# Patient Record
Sex: Female | Born: 1990 | Race: Black or African American | Hispanic: No | Marital: Single | Smoking: Never smoker
Health system: Southern US, Community
[De-identification: ages and names within clinical notes are randomized; demographics above are authoritative.]

## PROBLEM LIST (undated history)

## (undated) ENCOUNTER — Inpatient Hospital Stay (HOSPITAL_COMMUNITY): Payer: Self-pay

## (undated) DIAGNOSIS — R87629 Unspecified abnormal cytological findings in specimens from vagina: Secondary | ICD-10-CM

## (undated) DIAGNOSIS — J45909 Unspecified asthma, uncomplicated: Secondary | ICD-10-CM

## (undated) DIAGNOSIS — T7840XA Allergy, unspecified, initial encounter: Secondary | ICD-10-CM

## (undated) HISTORY — DX: Allergy, unspecified, initial encounter: T78.40XA

---
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
## 2014-09-10 ENCOUNTER — Encounter (HOSPITAL_COMMUNITY): Payer: Self-pay | Admitting: *Deleted

## 2014-09-10 ENCOUNTER — Inpatient Hospital Stay (HOSPITAL_COMMUNITY)
Admission: AD | Admit: 2014-09-10 | Discharge: 2014-09-10 | Disposition: A | Discharge status: Home / Self Care | Payer: 59 | Source: Ambulatory Visit | Attending: Obstetrics & Gynecology | Admitting: Obstetrics & Gynecology

## 2014-09-10 DIAGNOSIS — Z3201 Encounter for pregnancy test, result positive: Secondary | ICD-10-CM | POA: Insufficient documentation

## 2014-09-10 DIAGNOSIS — Z3200 Encounter for pregnancy test, result unknown: Secondary | ICD-10-CM | POA: Diagnosis present

## 2014-09-10 LAB — POCT PREGNANCY, URINE: PREG TEST UR: POSITIVE — AB

## 2014-09-10 NOTE — MAU Note (Signed)
Patient presents for a pregnancy test. States she had +HPT X 2. Denies bleeding or discharge

## 2014-09-10 NOTE — MAU Provider Note (Signed)
  History     CSN: [id number]  Arrival date and time: 09/10/14 1155   None     Chief Complaint  Patient presents with  . Possible Pregnancy   HPI [name] 24 y.o.  2495w3d  OB History    Gravida Para Term Preterm AB TAB SAB Ectopic Multiple Living   1 0        0      No past medical history on file.  No past surgical history on file.  No family history on file.  History  Substance Use Topics  . Smoking status: Not on file  . Smokeless tobacco: Not on file  . Alcohol Use: Not on file    Allergies: Allergies not on file  No prescriptions prior to admission    Review of Systems  Constitutional: Negative for fever.  Gastrointestinal: Negative for nausea, vomiting and abdominal pain.  Genitourinary:       No vaginal discharge. No vaginal bleeding. No dysuria  Musculoskeletal: Positive for back pain.   Physical Exam   Blood pressure 134/83, pulse 80, temperature 98.4 F (36.9 C), temperature source Oral, resp. rate 16, height 5\' 6"  (1.676 m), weight 138 lb 8 oz (62.823 kg), last menstrual period 08/17/2014.  Physical Exam  Nursing note and vitals reviewed. Constitutional: She is oriented to person, place, and time. She appears well-developed and well-nourished.  Moves without difficulty.  No pain today.  HENT:  Head: Normocephalic.  Eyes: EOM are normal.  Neck: Neck supple.  Musculoskeletal: Normal range of motion.  Neurological: She is alert and oriented to person, place, and time.  Skin: Skin is warm and dry.  Psychiatric: She has a normal mood and affect.    MAU Course  Procedures Results for orders placed or performed during the hospital encounter of 09/10/14 (from the past 24 hour(s))  Pregnancy, urine POC     Status: Abnormal   Collection Time: 09/10/14 12:16 PM  Result Value Ref Range   Preg Test, Ur POSITIVE (A) NEGATIVE    MDM Thought she might be pregnant.  Only needed to see for sure if she was pregnant.  No problems.  No further  exam needed.  Assessment and Plan  Pregnant at 8495w3d gestation  Plan Begin prenatal care as soon as possible - MD whose office is on [address]. No smoking, no alcohol, no drugs. Take Tylenol 325 mg 2 tablets by mouth every 4 hours if needed for pain.   BURLESON,TERRI 09/10/2014, 12:22 PM

## 2014-09-12 ENCOUNTER — Inpatient Hospital Stay (HOSPITAL_COMMUNITY)
Admission: AD | Admit: 2014-09-12 | Discharge: 2014-09-12 | Disposition: A | Discharge status: Home / Self Care | Payer: 59 | Source: Ambulatory Visit | Attending: Obstetrics & Gynecology | Admitting: Obstetrics & Gynecology

## 2014-09-12 ENCOUNTER — Encounter (HOSPITAL_COMMUNITY): Payer: Self-pay | Admitting: Obstetrics and Gynecology

## 2014-09-12 DIAGNOSIS — N926 Irregular menstruation, unspecified: Secondary | ICD-10-CM

## 2014-09-12 DIAGNOSIS — Z87891 Personal history of nicotine dependence: Secondary | ICD-10-CM | POA: Diagnosis not present

## 2014-09-12 DIAGNOSIS — Z3201 Encounter for pregnancy test, result positive: Secondary | ICD-10-CM | POA: Diagnosis not present

## 2014-09-12 DIAGNOSIS — R109 Unspecified abdominal pain: Secondary | ICD-10-CM | POA: Diagnosis present

## 2014-09-12 HISTORY — DX: Unspecified asthma, uncomplicated: J45.909

## 2014-09-12 LAB — URINALYSIS, ROUTINE W REFLEX MICROSCOPIC
Bilirubin Urine: NEGATIVE
GLUCOSE, UA: NEGATIVE mg/dL
Ketones, ur: NEGATIVE mg/dL
LEUKOCYTES UA: NEGATIVE
Nitrite: NEGATIVE
Protein, ur: 30 mg/dL — AB
UROBILINOGEN UA: 0.2 mg/dL (ref 0.0–1.0)
pH: 6 (ref 5.0–8.0)

## 2014-09-12 LAB — URINE MICROSCOPIC-ADD ON

## 2014-09-12 MED ORDER — PRENATAL VITAMINS 28-0.8 MG PO TABS: 1.0000 | ORAL_TABLET | Freq: Every day | ORAL | Status: DC

## 2014-09-12 NOTE — MAU Note (Signed)
Been having abd pain, comes and goes, feels like period cramps.

## 2014-09-12 NOTE — MAU Provider Note (Signed)
History     CSN: [id number]  Arrival date and time: 09/12/14 1247   None     Chief Complaint  Patient presents with  . Abdominal Pain   HPI   [name] is a 24 y.o. female G1P0 [redacted]w[redacted]d presenting to the MAU with abdominal cramping. She said that she first started noticing the cramping yesterday and the cramping occurs in the lower abdomen without radiating to one side. The cramps last only a few minutes. She is not currently in any pain. She currently rates her pain 0/10.  She says that the cramps feel just like her cramps during her period, which she says have always been bad. She also says that there is a little bit of low pack pain. She cramping is worse after eating [employer]. She endorses mild chills but denies any sharp stabbing pain, fever, vaginal discharge or bleeding, constipation, nausea, or vomiting.   She just found out that she was pregnant on 7/2 and has not had any prenatal care. She came in on &/4 and the pregnancy was confirmed via urine pregnancy test. She just wants to make sure that everything is okay with baby.   OB History    Gravida Para Term Preterm AB TAB SAB Ectopic Multiple Living        Past Medical History  Diagnosis Date  . Asthma     Past Surgical History  Procedure Laterality Date  . Wisdom tooth extraction Bilateral 2010    Family History  Problem Relation Age of Onset  . COPD Mother   . Seizures Brother   . Diabetes Paternal Aunt   . Diabetes Paternal Uncle   . Diabetes Paternal Grandmother   . Hypertension Paternal Grandmother   . Diabetes Paternal Grandfather   . Hypertension Paternal Grandfather     History  Substance Use Topics  . Smoking status: Former Smoker    Types: Cigars    Quit date: 09/10/2014  . Smokeless tobacco: Never Used  . Alcohol Use: No    Allergies:  Allergies  Allergen Reactions  . Banana Itching and Swelling  . Orange Fruit [Citrus] Itching and Swelling    No  prescriptions prior to admission   Results for orders placed or performed during the hospital encounter of 09/12/14 (from the past 48 hour(s))  Urinalysis, Routine w reflex microscopic (not at [facility])     Status: Abnormal   Collection Time: 09/12/14  1:45 PM  Result Value Ref Range   Color, Urine YELLOW YELLOW   APPearance CLEAR CLEAR   Specific Gravity, Urine >1.030 (H) 1.005 - 1.030   pH 6.0 5.0 - 8.0   Glucose, UA NEGATIVE NEGATIVE mg/dL   Hgb urine dipstick SMALL (A) NEGATIVE   Bilirubin Urine NEGATIVE NEGATIVE   Ketones, ur NEGATIVE NEGATIVE mg/dL   Protein, ur 30 (A) NEGATIVE mg/dL   Urobilinogen, UA 0.2 0.0 - 1.0 mg/dL   Nitrite NEGATIVE NEGATIVE   Leukocytes, UA NEGATIVE NEGATIVE  Urine microscopic-add on     Status: Abnormal   Collection Time: 09/12/14  1:45 PM  Result Value Ref Range   Squamous Epithelial / LPF FEW (A) RARE   WBC, UA 0-2 <3 WBC/hpf   RBC / HPF 0-2 <3 RBC/hpf   Bacteria, UA FEW (A) RARE   Urine-Other MUCOUS PRESENT     Review of Systems  Constitutional: Positive for chills (gets cold easily). Negative for fever and malaise/fatigue.  Gastrointestinal: Negative  for nausea, vomiting, abdominal pain, constipation and blood in stool.  Genitourinary: Negative for dysuria, urgency, frequency and hematuria.       Denies vaginal bleeding and discharge   Physical Exam   Blood pressure 107/73, pulse 85, temperature 98.4 F (36.9 C), temperature source Oral, resp. rate 18, height 5' 5.5" (1.664 m), weight 62.143 kg (137 lb), last menstrual period 08/17/2014, SpO2 100 %.  Physical Exam  Constitutional: She is oriented to person, place, and time. She appears well-developed and well-nourished. No distress.  GI: Soft. Bowel sounds are normal. There is no tenderness.  Neurological: She is alert and oriented to person, place, and time.  Skin: She is not diaphoretic.  Psychiatric: Her behavior is normal.    MAU Course  Procedures  None  MDM Patient discharged  for intermittent cramping. Not currently in pain.  Assessment and Plan   A:  1. Missed period   2. Encounter for pregnancy test, result positive    P:  Discharge home in stable condition Pregnancy verification letter given First trimester warning signs discussed. RX: prescribed prenatal vitamins. -Patient educated on worrisome signs and symptoms during pregnancy. -Follow up with prenatal care provider or return to MAU if experiencing any vaginal bleeding or abdominal pain.     Duane LopeJennifer I Emric Kowalewski, NP 09/12/2014 3:32 PM

## 2014-11-01 LAB — OB RESULTS CONSOLE HEPATITIS B SURFACE ANTIGEN: Hepatitis B Surface Ag: NEGATIVE

## 2014-11-01 LAB — OB RESULTS CONSOLE HIV ANTIBODY (ROUTINE TESTING): HIV: NONREACTIVE

## 2014-11-01 LAB — OB RESULTS CONSOLE RPR: RPR: NONREACTIVE

## 2014-11-01 LAB — OB RESULTS CONSOLE ANTIBODY SCREEN: Antibody Screen: NEGATIVE

## 2014-11-01 LAB — OB RESULTS CONSOLE ABO/RH: RH Type: POSITIVE

## 2014-11-01 LAB — OB RESULTS CONSOLE GC/CHLAMYDIA
CHLAMYDIA, DNA PROBE: NEGATIVE
GC PROBE AMP, GENITAL: NEGATIVE

## 2014-11-01 LAB — OB RESULTS CONSOLE RUBELLA ANTIBODY, IGM: RUBELLA: NON-IMMUNE/NOT IMMUNE

## 2014-11-02 ENCOUNTER — Other Ambulatory Visit (HOSPITAL_COMMUNITY): Payer: Self-pay | Admitting: Nurse Practitioner

## 2014-11-02 DIAGNOSIS — Z3A13 13 weeks gestation of pregnancy: Secondary | ICD-10-CM

## 2014-11-02 DIAGNOSIS — Z3682 Encounter for antenatal screening for nuchal translucency: Secondary | ICD-10-CM

## 2014-11-20 ENCOUNTER — Ambulatory Visit (HOSPITAL_COMMUNITY): Payer: Medicaid Other

## 2014-11-20 ENCOUNTER — Other Ambulatory Visit (HOSPITAL_COMMUNITY): Payer: 59

## 2014-11-26 ENCOUNTER — Other Ambulatory Visit (HOSPITAL_COMMUNITY): Payer: Self-pay | Admitting: Nurse Practitioner

## 2014-11-26 DIAGNOSIS — O09892 Supervision of other high risk pregnancies, second trimester: Secondary | ICD-10-CM

## 2014-11-26 DIAGNOSIS — Z141 Cystic fibrosis carrier: Principal | ICD-10-CM

## 2014-11-26 DIAGNOSIS — Z3A19 19 weeks gestation of pregnancy: Secondary | ICD-10-CM

## 2014-12-31 ENCOUNTER — Other Ambulatory Visit (HOSPITAL_COMMUNITY): Payer: Self-pay | Admitting: Obstetrics & Gynecology

## 2015-01-01 ENCOUNTER — Encounter: Payer: Self-pay | Admitting: Obstetrics & Gynecology

## 2015-01-01 ENCOUNTER — Other Ambulatory Visit (HOSPITAL_COMMUNITY): Payer: Self-pay | Admitting: Nurse Practitioner

## 2015-01-01 ENCOUNTER — Ambulatory Visit (HOSPITAL_COMMUNITY)
Admission: RE | Admit: 2015-01-01 | Discharge: 2015-01-01 | Disposition: A | Discharge status: Home / Self Care | Payer: Medicaid Other | Source: Ambulatory Visit | Attending: Nurse Practitioner | Admitting: Nurse Practitioner

## 2015-01-01 DIAGNOSIS — O09892 Supervision of other high risk pregnancies, second trimester: Secondary | ICD-10-CM | POA: Diagnosis not present

## 2015-01-01 DIAGNOSIS — Z141 Cystic fibrosis carrier: Secondary | ICD-10-CM

## 2015-01-01 DIAGNOSIS — Z3689 Encounter for other specified antenatal screening: Secondary | ICD-10-CM

## 2015-01-01 DIAGNOSIS — Z3A19 19 weeks gestation of pregnancy: Secondary | ICD-10-CM | POA: Insufficient documentation

## 2015-01-01 DIAGNOSIS — Z36 Encounter for antenatal screening of mother: Secondary | ICD-10-CM | POA: Diagnosis not present

## 2015-01-01 HISTORY — DX: Cystic fibrosis carrier: Z14.1

## 2015-01-03 NOTE — Progress Notes (Signed)
Genetic Counseling  High-Risk Gestation Note  Appointment Date:  01/01/2015 Referred By: Shannon Click, NP Date of Birth:  [birth year] Partner: [name]   Pregnancy History: G1P0000 Estimated Date of Delivery: 05/24/15 Estimated Gestational Age: 25w4dAttending: MRenella Cunas MD   I met with Ms. 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  [employer] Genetic Counseling Intern, MSilva Bandy assisted with genetic counseling under my direct supervision.   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 CLamar SprinklesCF carrier screening.  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 CF carrier screening has not yet been performed for her partner.  Ms. HCommonreported no additional relatives known to be CF carriers and no known relatives with cystic fibrosis.  Mr. BArdith Darkreported no known individuals with cystic fibrosis in his family history, and consanguinity to Ms. CJACKELYNE SAYERwas denied. He reported African ancestry; [place] Both family histories were reviewed and were otherwise negative for birth defects, intellectual disability, and known genetic conditions. Without further information regarding the provided family history, an accurate genetic risk cannot be calculated. Further genetic counseling is warranted if more information is obtained.  The  couple was provided with written information regarding cystic fibrosis. Classic features of CF include thickened secretions in the lungs, digestive and reproductive systems.  This life-limiting condition is characterized by chronic respiratory infections requiring daily chest therapies, pancreatic dysfunction disrupting the body's ability to break down food and extract nutrients as it should, which may restrict growth.  Infertility commonly occurs in males.  With therapies, such as daily respiratory therapies and medications to aid digestion, the median lifespan for people with CF is now mid-30's. Treatment may involve lung transplantation in some cases. There can be significant variability in the severity of symptoms and expression of the disease; severity cannot be predicted prenatally.  Currently over 1,000 mutations have been identified in the CFTR gene; about 70% of individuals with Caucasian ancestry who are CF carriers have delta F508. This particular mutation is associated with the classic form of CF. However, expression of the disease may be modified depending upon the second mutation present in an individual with CF.   We spent time reviewing genes and the autosomal recessive inheritance of cystic fibrosis. We discussed that the carrier frequency of the condition varies by ethnicity and approximately 1 in 646individuals with African ancestry is a carrier for CF. We discussed that individuals who are carriers have one copy of the CFTR gene with a disease causing mutation, and their other CFTR gene copy functions correctly. Thus, carriers typically do not have associated medical symptoms. We discussed that when both parents are carriers for CF, each pregnancy has an independent chance for one of the following outcomes: a 25% chance to inherit both mutations and thus have CF; a 50% chance to inherit one gene mutation and be a carrier similar to parents; and a 25%  chance to be neither a carrier nor have CF.  When one parent is a CF carrier but the other is not, then each pregnancy has a 1 in 2 chance to be a CF carrier but would not be expected to be at increased risk to inherit CF.   There are thought to be thousands of mutations which can cause the CFTR gene to not function properly. Carrier screening is available to assess for the most common disease causing mutations. However, carrier screening does not identify all CF carriers. Thus, a negative CF carrier screen would reduce but not eliminate the chance to be a CF carrier and thus the chance for CF in a pregnancy.  Carrier screening for the most common mutations detects approximately 82% of carriers in the African American population.  Given [name] reported family history, he would have the general population risk to be a carrier of approximately 1 in 23, prior to carrier screening. Thus, given that [name] is a known CF carrier, the risk for CF to be in the current pregnancy, prior to carrier screening for [name], is approximately 1 in 260 (0.4%).  We discussed that CF carrier screening for [name] would further refine the risk for CF in the current pregnancy. After careful consideration, [name] elected to proceed with CF carrier screening using an extended panel (600 mutations) through [facility], which was performed at the time of today's visit. We will contact the patient and her partner once these results are available and forward to her OB office.  We reviewed that when both parents are identified to be CF carriers, prenatal diagnosis via amniocentesis (or chorionic villus sampling in the first trimester) would be available, if desired. The risks, benefits, and limitations of amniocentesis were reviewed. A fetus with cystic fibrosis typically appears normal on targeted ultrasound, although rarely echogenic bowel is visualized on ultrasound. However, the presence of echogenic bowel on targeted ultrasound is not diagnostic  for CF in a pregnancy, nor does the absence of echogenic bowel on ultrasound rule out CF in the pregnancy. We discussed that postnatal testing for CF can also be performed for babies identified to be at risk to inherit CF. They understand that in [place], the newborn screening test will detect CF, but that carriers may come back as false positives.    Ms. HUDA PETREY denied exposure to environmental toxins or chemical agents. She denied the use of alcohol, tobacco or street drugs. She denied significant viral illnesses during the course of her pregnancy. Her medical and surgical histories were noncontributory.   I counseled this couple regarding the above risks and available options.  The approximate face-to-face time with the genetic counselor was 45 minutes.  Chipper Oman, MS Certified Genetic Counselor 01/03/2015

## 2015-01-10 ENCOUNTER — Telehealth (HOSPITAL_COMMUNITY): Payer: Self-pay | Admitting: MS"

## 2015-01-10 NOTE — Telephone Encounter (Signed)
Left message for patient to return call.   [name] 01/10/2015 10:53 AM

## 2015-01-11 NOTE — Telephone Encounter (Signed)
Called [name] regarding cystic fibrosis carrier screening results for the father of the pregnancy, [name]. Carrier screening for cystic fibrosis yielded a normal/negative result for the 600 disease-causing mutations screened, meaning that the risk to be a CF carrier for him can be reduced from ~1 in 61 to approximately 1 in 335.  Therefore, the risk for cystic fibrosis in her current pregnancy is reduced to 1 in 1340. The patient understands that CF carrier screening can reduce but not eliminate the chance to be a CF carrier.   Clydie BraunKaren Brolin Dambrosia 01/11/2015 9:20 AM

## 2015-02-07 ENCOUNTER — Other Ambulatory Visit (HOSPITAL_COMMUNITY): Payer: Self-pay

## 2015-03-10 NOTE — L&D Delivery Note (Addendum)
Patient is 25 y.o. G1P0000 [redacted]w[redacted]d admitted for SOL   Delivery Note At 3:04 PM a viable female was delivered via Vaginal, Spontaneous Delivery (Presentation: Right Occiput Anterior).  APGAR: , 9; weight pending .   Placenta status: Intact, Spontaneous.  Cord: 3 vessels with the following complications: None.  Cord pH: n/a. Patient had increased bleeding after placenta delivered. Subsequently, Cytotec 800 mcg and Methergine 0.2mg  x 2 were given.  Anesthesia: Epidural  Episiotomy: None Lacerations:  3rd degree perineal and bilateral periurethral, repaired by Dr Macon LargeAnyanwu Suture Repair: 0 vicryl and 3.0 monocryl Est. Blood Loss (mL):  600 cc  Mom to postpartum.  Baby to Couplet care / Skin to Skin.  Beaulah Dinninghristina M Gambino, MD 05/29/2015, 4:31 PM  I was present for this delivery and agree with the above resident's note.  [name], LISA, CNM 6:40 PM   Attestation of Attending Supervision of Advanced Practice Provider (PA/CNM/NP): Evaluation and management procedures were performed by the Advanced Practice Provider under my supervision and collaboration.  I have reviewed the Advanced Practice Provider's note and chart, and I agree with the management and plan.  Patient's third degree laceration was 3b (>50% of external anal sphincter) was torn.  This was repaired with 0 Vicryl and 3-0 Vicryl in the usual fashion. I performed the entire repair.   Jaynie CollinsUGONNA  ANYANWU, MD, FACOG Attending Obstetrician & Gynecologist Faculty Practice, [facility] [place]

## 2015-04-24 ENCOUNTER — Inpatient Hospital Stay (HOSPITAL_COMMUNITY)
Admission: AD | Admit: 2015-04-24 | Discharge: 2015-04-25 | Disposition: A | Discharge status: Home / Self Care | Payer: Medicaid Other | Source: Ambulatory Visit | Attending: Obstetrics & Gynecology | Admitting: Obstetrics & Gynecology

## 2015-04-24 ENCOUNTER — Encounter (HOSPITAL_COMMUNITY): Payer: Self-pay | Admitting: *Deleted

## 2015-04-24 DIAGNOSIS — Z141 Cystic fibrosis carrier: Secondary | ICD-10-CM | POA: Insufficient documentation

## 2015-04-24 DIAGNOSIS — O9989 Other specified diseases and conditions complicating pregnancy, childbirth and the puerperium: Secondary | ICD-10-CM | POA: Diagnosis not present

## 2015-04-24 DIAGNOSIS — Z87891 Personal history of nicotine dependence: Secondary | ICD-10-CM | POA: Insufficient documentation

## 2015-04-24 DIAGNOSIS — R109 Unspecified abdominal pain: Secondary | ICD-10-CM | POA: Insufficient documentation

## 2015-04-24 DIAGNOSIS — J45909 Unspecified asthma, uncomplicated: Secondary | ICD-10-CM | POA: Insufficient documentation

## 2015-04-24 DIAGNOSIS — Z91018 Allergy to other foods: Secondary | ICD-10-CM | POA: Insufficient documentation

## 2015-04-24 DIAGNOSIS — Z3A35 35 weeks gestation of pregnancy: Secondary | ICD-10-CM | POA: Insufficient documentation

## 2015-04-24 DIAGNOSIS — O26893 Other specified pregnancy related conditions, third trimester: Secondary | ICD-10-CM | POA: Diagnosis not present

## 2015-04-24 DIAGNOSIS — N949 Unspecified condition associated with female genital organs and menstrual cycle: Secondary | ICD-10-CM

## 2015-04-24 LAB — URINALYSIS, ROUTINE W REFLEX MICROSCOPIC
Bilirubin Urine: NEGATIVE
GLUCOSE, UA: NEGATIVE mg/dL
Hgb urine dipstick: NEGATIVE
KETONES UR: NEGATIVE mg/dL
LEUKOCYTES UA: NEGATIVE
NITRITE: NEGATIVE
PH: 6 (ref 5.0–8.0)
Protein, ur: NEGATIVE mg/dL
Specific Gravity, Urine: >1.03 — ABNORMAL HIGH (ref 1.005–1.030)

## 2015-04-24 NOTE — MAU Note (Signed)
Pt reports lower abd cramping and rectal pressure. Denies bleeding.

## 2015-04-24 NOTE — MAU Note (Signed)
Pt c/o one sharp pain in her lower abdomen that lasted a few seconds yesterday, then it subsided. Pt then said thirty minutes ago she had lower abdominal pressure that started and will not go away. She also felt rectal pressure for about 3 minutes, it has subsided.  Pt denies LOF and VB. +FM. Pt also denies constipation but hasnt had a bowel movement in 2 days.

## 2015-04-24 NOTE — Discharge Instructions (Signed)
Round Ligament Pain  The round ligament is a cord of muscle and tissue that helps to support the uterus. It can become a source of pain during pregnancy if it becomes stretched or twisted as the baby grows. The pain usually begins in the second trimester of pregnancy, and it can come and go until the baby is delivered. It is not a serious problem, and it does not cause harm to the baby.  Round ligament pain is usually a short, sharp, and pinching pain, but it can also be a dull, lingering, and aching pain. The pain is felt in the lower side of the abdomen or in the groin. It usually starts deep in the groin and moves up to the outside of the hip area. Pain can occur with:   A sudden change in position.   Rolling over in bed.   Coughing or sneezing.   Physical activity.  HOME CARE INSTRUCTIONS  Watch your condition for any changes. Take these steps to help with your pain:   When the pain starts, relax. Then try:    Sitting down.    Flexing your knees up to your abdomen.    Lying on your side with one pillow under your abdomen and another pillow between your legs.    Sitting in a warm bath for 15-20 minutes or until the pain goes away.   Take over-the-counter and prescription medicines only as told by your health care provider.   Move slowly when you sit and stand.   Avoid long walks if they cause pain.   Stop or lessen your physical activities if they cause pain.  SEEK MEDICAL CARE IF:   Your pain does not go away with treatment.   You feel pain in your back that you did not have before.   Your medicine is not helping.  SEEK IMMEDIATE MEDICAL CARE IF:   You develop a fever or chills.   You develop uterine contractions.   You develop vaginal bleeding.   You develop nausea or vomiting.   You develop diarrhea.   You have pain when you urinate.     This information is not intended to replace advice given to you by your health care provider. Make sure you discuss any questions you have with your health  care provider.     Document Released: 12/03/2007 Document Revised: 05/18/2011 Document Reviewed: 05/02/2014  Elsevier Interactive Patient Education 2016 [employer]

## 2015-04-24 NOTE — MAU Provider Note (Signed)
  History     CSN: [id number]  Arrival date and time: 04/24/15 2255   None     Chief Complaint  Patient presents with  . Abdominal Pain   HPI [name] is a 24yo G1 @ 35.6wks who presents for eval of low abd pressure that started approx 30 mins before she arrived. Denies leaking or bldg; no dysuria. Reports +FM. 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 Her preg has been [facility] by the [facility] and has been remarkable for 1) CF carrier 2) hx abnl Pap  OB History    Gravida Para Term Preterm AB TAB SAB Ectopic Multiple Living        Past Medical History  Diagnosis Date  . Asthma     Past Surgical History  Procedure Laterality Date  . Wisdom tooth extraction Bilateral 2010    Family History  Problem Relation Age of Onset  . COPD Mother   . Seizures Brother   . Diabetes Paternal Aunt   . Diabetes Paternal Uncle   . Diabetes Paternal Grandmother   . Hypertension Paternal Grandmother   . Diabetes Paternal Grandfather   . Hypertension Paternal Grandfather     Social History  Substance Use Topics  . Smoking status: Former Smoker    Types: Cigars    Quit date: 09/10/2014  . Smokeless tobacco: Never Used  . Alcohol Use: No    Allergies:  Allergies  Allergen Reactions  . Banana Itching and Swelling  . Orange Fruit [Citrus] Itching and Swelling    Prescriptions prior to admission  Medication Sig Dispense Refill Last Dose  . Prenatal Vit-Fe Fumarate-FA (PRENATAL VITAMINS) 28-0.8 MG TABS Take 1 tablet by mouth daily. 30 tablet 3 04/23/2015 at Unknown time    ROS Physical Exam   Blood pressure 118/77, pulse 86, temperature 98.6 F (37 C), temperature source Oral, resp. rate 16, height  (1.651 m), weight 74.39 kg (164 lb), last menstrual period 08/17/2014, SpO2 100 %.  Physical Exam  Constitutional: She is oriented to person, place, and time. She appears well-developed.  HENT:  Head: Normocephalic.   Neck: Normal range of motion.  Cardiovascular: Normal rate.   Respiratory: Effort normal.  GI:  EFM 130s, +accels, no decels No ctx per toco  Genitourinary: Vagina normal.  Cx C/L/post  Musculoskeletal: Normal range of motion.  Neurological: She is alert and oriented to person, place, and time.  Skin: Skin is warm and dry.  Psychiatric: She has a normal mood and affect. Her behavior is normal. Thought content normal.   Urinalysis    Component Value Date/Time   COLORURINE YELLOW 04/24/2015 2255   APPEARANCEUR CLEAR 04/24/2015 2255   LABSPEC >1.030* 04/24/2015 2255   PHURINE 6.0 04/24/2015 2255   GLUCOSEU NEGATIVE 04/24/2015 2255   HGBUR NEGATIVE 04/24/2015 2255   BILIRUBINUR NEGATIVE 04/24/2015 2255   KETONESUR NEGATIVE 04/24/2015 2255   PROTEINUR NEGATIVE 04/24/2015 2255   UROBILINOGEN 0.2 09/12/2014 1345   NITRITE NEGATIVE 04/24/2015 2255   LEUKOCYTESUR NEGATIVE 04/24/2015 2255     MAU Course  Procedures  MDM NST read UA ordered Cx exam  Assessment and Plan  IUP@35 .6wks Round lig pain  D/C home with comfort meas rev'd F/U as scheduled at next visit  Cam Hai CNM 04/24/2015, 11:51 PM

## 2015-04-25 DIAGNOSIS — O26893 Other specified pregnancy related conditions, third trimester: Secondary | ICD-10-CM | POA: Diagnosis not present

## 2015-05-17 LAB — OB RESULTS CONSOLE GBS: GBS: NEGATIVE

## 2015-05-22 ENCOUNTER — Inpatient Hospital Stay (HOSPITAL_COMMUNITY): Admission: AD | Admit: 2015-05-22 | Payer: 59 | Source: Ambulatory Visit

## 2015-05-28 ENCOUNTER — Inpatient Hospital Stay (HOSPITAL_COMMUNITY)
Admission: AD | Admit: 2015-05-28 | Discharge: 2015-05-31 | DRG: 775 | Disposition: A | Discharge status: Home / Self Care | Payer: Medicaid Other | Source: Ambulatory Visit | Attending: Obstetrics and Gynecology | Admitting: Obstetrics and Gynecology

## 2015-05-28 ENCOUNTER — Encounter (HOSPITAL_COMMUNITY): Payer: Self-pay | Admitting: Family Medicine

## 2015-05-28 DIAGNOSIS — Z141 Cystic fibrosis carrier: Secondary | ICD-10-CM | POA: Diagnosis not present

## 2015-05-28 DIAGNOSIS — Z3A40 40 weeks gestation of pregnancy: Secondary | ICD-10-CM

## 2015-05-28 DIAGNOSIS — Z833 Family history of diabetes mellitus: Secondary | ICD-10-CM | POA: Diagnosis not present

## 2015-05-28 DIAGNOSIS — Z87891 Personal history of nicotine dependence: Secondary | ICD-10-CM | POA: Diagnosis not present

## 2015-05-28 DIAGNOSIS — Z8249 Family history of ischemic heart disease and other diseases of the circulatory system: Secondary | ICD-10-CM | POA: Diagnosis not present

## 2015-05-28 DIAGNOSIS — Z825 Family history of asthma and other chronic lower respiratory diseases: Secondary | ICD-10-CM

## 2015-05-28 DIAGNOSIS — Z3490 Encounter for supervision of normal pregnancy, unspecified, unspecified trimester: Secondary | ICD-10-CM

## 2015-05-28 HISTORY — DX: Unspecified abnormal cytological findings in specimens from vagina: R87.629

## 2015-05-28 LAB — CBC
HEMATOCRIT: 37.5 % (ref 36.0–46.0)
HEMOGLOBIN: 13.2 g/dL (ref 12.0–15.0)
MCH: 33.2 pg (ref 26.0–34.0)
MCHC: 35.2 g/dL (ref 30.0–36.0)
MCV: 94.5 fL (ref 78.0–100.0)
Platelets: 250 10*3/uL (ref 150–400)
RBC: 3.97 MIL/uL (ref 3.87–5.11)
RDW: 13.5 % (ref 11.5–15.5)
WBC: 10.3 10*3/uL (ref 4.0–10.5)

## 2015-05-28 LAB — TYPE AND SCREEN
ABO/RH(D): B POS
Antibody Screen: NEGATIVE

## 2015-05-28 LAB — ABO/RH: ABO/RH(D): B POS

## 2015-05-28 MED ORDER — ACETAMINOPHEN 325 MG PO TABS: 650.0000 mg | ORAL_TABLET | ORAL | Status: DC | PRN

## 2015-05-28 MED ORDER — OXYTOCIN BOLUS FROM INFUSION
500.0000 mL | INTRAVENOUS | Status: DC
  Administered 2015-05-29: 500 mL via INTRAVENOUS

## 2015-05-28 MED ORDER — LACTATED RINGERS IV SOLN
500.0000 mL | INTRAVENOUS | Status: DC | PRN
  Administered 2015-05-29: 500 mL via INTRAVENOUS

## 2015-05-28 MED ORDER — FLEET ENEMA 7-19 GM/118ML RE ENEM: 1.0000 | ENEMA | RECTAL | Status: DC | PRN

## 2015-05-28 MED ORDER — OXYCODONE-ACETAMINOPHEN 5-325 MG PO TABS: 2.0000 | ORAL_TABLET | ORAL | Status: DC | PRN

## 2015-05-28 MED ORDER — OXYTOCIN 10 UNIT/ML IJ SOLN: 2.5000 [IU]/h | INTRAVENOUS | Status: DC

## 2015-05-28 MED ORDER — FENTANYL CITRATE (PF) 100 MCG/2ML IJ SOLN
100.0000 ug | INTRAMUSCULAR | Status: DC | PRN
  Administered 2015-05-29: 100 ug via INTRAVENOUS
  Filled 2015-05-28 (×3): qty 2

## 2015-05-28 MED ORDER — CITRIC ACID-SODIUM CITRATE 334-500 MG/5ML PO SOLN: 30.0000 mL | ORAL | Status: DC | PRN

## 2015-05-28 MED ORDER — LIDOCAINE HCL (PF) 1 % IJ SOLN
30.0000 mL | INTRAMUSCULAR | Status: AC | PRN
  Administered 2015-05-29: 30 mL via SUBCUTANEOUS
  Filled 2015-05-28: qty 30

## 2015-05-28 MED ORDER — OXYCODONE-ACETAMINOPHEN 5-325 MG PO TABS: 1.0000 | ORAL_TABLET | ORAL | Status: DC | PRN

## 2015-05-28 MED ORDER — LACTATED RINGERS IV SOLN
INTRAVENOUS | Status: DC
  Administered 2015-05-28 – 2015-05-29 (×2): via INTRAVENOUS

## 2015-05-28 MED ORDER — ONDANSETRON HCL 4 MG/2ML IJ SOLN: 4.0000 mg | Freq: Four times a day (QID) | INTRAMUSCULAR | Status: DC | PRN

## 2015-05-28 NOTE — MAU Note (Signed)
Pt C/O lower abd cramping since 0500, also diarrhea.  Denies bleeding or LOF.

## 2015-05-28 NOTE — Progress Notes (Signed)
[name] is a 25 y.o. G1P0000 at 4220w4d by LMP admitted for SOL.  Subjective: The patient is doing well, resting comfortably sitting up in her bed. The patient states that her contractions are now further apart and less painful now that she is sitting upright, rather than lying flat on her back. The patient has been utilizing the inflatable birthing chair, birthing ball, and she has been walking in her room.  Objective: BP 127/84 mmHg  Pulse 90  Temp(Src) 98.9 F (37.2 C) (Oral)  Resp 20  Ht 5\' 5"  (1.651 m)  Wt 75.751 kg (167 lb)  BMI 27.79 kg/m2  LMP 08/17/2014  FHT:  FHR: 145 bpm, variability: moderate,  accelerations:  Present,  decelerations:  Absent UC:   irregular, every 3-6 minutes SVE:   Dilation: 4 Effacement (%): 80 Station: -2 Exam by:: Dr Jonathon JordanGambino  Labs: Lab Results  Component Value Date   WBC 10.3 05/28/2015   HGB 13.2 05/28/2015   HCT 37.5 05/28/2015   MCV 94.5 05/28/2015   PLT 250 05/28/2015    Assessment / Plan: Spontaneous labor, progressing normally  Labor: Progressing normally Fetal Wellbeing:  Category I Pain Control:  Epidural (planned) Anticipated MOD:  NSVD  Cephus ShellingAndrew P Naren Benally PA-S 05/28/2015, 8:54 PM

## 2015-05-28 NOTE — Progress Notes (Addendum)
[name] is a 25 y.o. G1P0000 at 4155w4d by LMP admitted for SOL.   Subjective: Pt is walking and using sitting on the ball to facilitate labor progression. She is feeling her contractions more frequently now and they are becoming more painful.   Objective: BP 118/74 mmHg  Pulse 102  Temp(Src) 98.8 F (37.1 C) (Oral)  Resp 18  LMP 08/17/2014      FHT:  FHR: 130 bpm, variability: moderate,  accelerations:  Present,  decelerations:  Absent UC:   regular, every 3-6 minutes SVE:   Dilation: 4 Effacement (%): 80 Station: -2 Exam by: Dr. Jonathon JordanGambino  Labs: Lab Results  Component Value Date   WBC 5.9 12/30/2006   HGB 12.1 12/30/2006   HCT 36.2 12/30/2006   MCV 92.0 12/30/2006   PLT 285 12/30/2006    Assessment / Plan: Spontaneous labor, progressing normally  Labor: Progressing normally, will hold off on any augmentation at this time Fetal Wellbeing:  Category I, currently on intermittent monitoring Pain Control:  Labor support without medications, intends to have epidural placed I/D:  n/a Anticipated MOD:  NSVD  Patient seen by myself and [name], MS3   Beaulah Dinninghristina M Glennie Bose 05/28/2015, 4:56 PM

## 2015-05-28 NOTE — H&P (Signed)
OBSTETRIC ADMISSION HISTORY AND PHYSICAL  [name] is a 25 y.o. female G1P0000 with IUP at 7359w4d by LMP presenting for SOL. She reports +FMs, No LOF, no VB, no blurry vision, headaches or peripheral edema, and RUQ pain.  She plans on breast feeding. She requests nothing for birth control. Contractions felt every 8 minutes. No complications this pregnancy per patient.   Dating: By LMP --->  Estimated Date of Delivery: 05/24/15   Prenatal History/Complications: None  Past Medical History: Past Medical History  Diagnosis Date  . Asthma- does not use inhaler, no asthma attacks since childhood     Past Surgical History: Past Surgical History  Procedure Laterality Date  . Wisdom tooth extraction Bilateral 2010    Obstetrical History: OB History    Gravida Para Term Preterm AB TAB SAB Ectopic Multiple Living   1 0 0 0 0 0 0 0 0 0       Social History: Social History   Social History  . Marital Status: Single    Spouse Name: N/A  . Number of Children: N/A  . Years of Education: N/A   Social History Main Topics  . Smoking status: Former Smoker    Types: Cigars    Quit date: 09/10/2014  . Smokeless tobacco: Never Used  . Alcohol Use: No  . Drug Use: No  . Sexual Activity: No   Other Topics Concern  . None   Social History Narrative    Family History: Family History  Problem Relation Age of Onset  . COPD Mother   . Seizures Brother   . Diabetes Paternal Aunt   . Diabetes Paternal Uncle   . Diabetes Paternal Grandmother   . Hypertension Paternal Grandmother   . Diabetes Paternal Grandfather   . Hypertension Paternal Grandfather     Allergies: Allergies  Allergen Reactions  . Banana Itching and Swelling  . Orange Fruit [Citrus] Itching and Swelling    Prescriptions prior to admission  Medication Sig Dispense Refill Last Dose  . calcium carbonate (TUMS - DOSED IN MG ELEMENTAL CALCIUM) 500 MG chewable tablet Chew 2 tablets by mouth daily.    05/27/2015 at Unknown time  . Pediatric Multiple Vit-C-FA (FLINSTONES GUMMIES OMEGA-3 DHA PO) Take 2 each by mouth daily.   Past Week at Unknown time     Review of Systems   All systems reviewed and negative except as stated in HPI  Blood pressure 127/87, pulse 79, temperature 98.2 F (36.8 C), temperature source Oral, resp. rate 20, last menstrual period 08/17/2014. General appearance: alert, cooperative and no distress Lungs: clear to auscultation bilaterally Heart: regular rate and rhythm Abdomen: soft, non-tender; bowel sounds normal Extremities: Homans sign is negative, no bilateral LE edema Presentation: cephalic Fetal monitoringBaseline: 130 bpm, Variability: Good {> 6 bpm), Accelerations: Reactive and Decelerations: Absent Uterine activityFrequency: Every 3-6 minutes Dilation: 3.5 Effacement (%): 80 Station: -2 Exam by:: Dr. Jonathon JordanGambino   Prenatal labs: ABO, Rh:  B Pos  Antibody:  Neg  Rubella: Non-immune RPR:   Neg HBsAg:  Neg  HIV:   None seen in chart GBS:    1 hr Glucola 113 Genetic screening  none Anatomy US: none seen in Epic  Prenatal Transfer Tool  Maternal Diabetes: No Genetic Screening: Normal Maternal Ultrasounds/Referrals: Normal Fetal Ultrasounds or other Referrals:  None Maternal Substance Abuse:  No Significant Maternal Medications:  None Significant Maternal Lab Results: Lab values include: Group B Strep negative, Other: cystic fibrosis carrier  No results found for this  or any previous visit (from the past 24 hour(s)).  Patient Active Problem List   Diagnosis Date Noted  . 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 G1P0000 at [redacted]w[redacted]d here for SOL.  #Labor: Expectant management, monitor cervical change q4h #Pain: Plans for epidural when appropriate #FWB: Category 1, intermittent monitoring  #ID: Negative #MOF: Breast and bottle #MOC: None #Circ: Yes, outpatient  Beaulah Dinning 05/28/2015, 12:48 PM    OB fellow attestation: I have seen and examined this patient; I agree with above documentation in the resident's note.   [name] is a 24 y.o. G1P0000 here for contractions  PE: BP 118/74 mmHg  Pulse 102  Temp(Src) 99.1 F (37.3 C) (Oral)  Resp 18  LMP 08/17/2014 Gen: calm comfortable, NAD Resp: normal effort, no distress Abd: gravid  ROS, labs, PMH reviewed  Plan: Early labor, possibly transitioning to active labor Coping strategies discussed, encouraged ambulation. If ctx stop, will consider discharge Intermittent monitoring appropriate for this patient, NST reactive.  GBS neg  Federico Flake, MD  Family Medicine, OB Fellow 05/28/2015, 5:15 PM

## 2015-05-28 NOTE — Consults (Signed)
  Anesthesia Pain Consult Note  Patient: [name], 25 y.o., female  Consult Requested by: Adam PhenixJames G Arnold, MD  Reason for Consult: CRNA pain rounds  Level of Consciousness: alert  Pain:c urrent pain 10, pain goal 7, in early labor, desires epidural      [facility] LLCBURGER,Sri Clegg 05/28/2015

## 2015-05-29 ENCOUNTER — Inpatient Hospital Stay (HOSPITAL_COMMUNITY): Payer: Medicaid Other | Admitting: Anesthesiology

## 2015-05-29 ENCOUNTER — Encounter (HOSPITAL_COMMUNITY): Payer: Self-pay | Admitting: Anesthesiology

## 2015-05-29 DIAGNOSIS — Z3A40 40 weeks gestation of pregnancy: Secondary | ICD-10-CM

## 2015-05-29 DIAGNOSIS — Z141 Cystic fibrosis carrier: Secondary | ICD-10-CM

## 2015-05-29 DIAGNOSIS — Z87891 Personal history of nicotine dependence: Secondary | ICD-10-CM

## 2015-05-29 LAB — RPR: RPR: NONREACTIVE

## 2015-05-29 MED ORDER — PHENYLEPHRINE 40 MCG/ML (10ML) SYRINGE FOR IV PUSH (FOR BLOOD PRESSURE SUPPORT)
80.0000 ug | PREFILLED_SYRINGE | INTRAVENOUS | Status: DC | PRN
  Filled 2015-05-29: qty 20
  Filled 2015-05-29: qty 2

## 2015-05-29 MED ORDER — OXYTOCIN 10 UNIT/ML IJ SOLN: 2.5000 [IU]/h | INTRAMUSCULAR | Status: DC | PRN

## 2015-05-29 MED ORDER — BENZOCAINE-MENTHOL 20-0.5 % EX AERO
1.0000 "application " | INHALATION_SPRAY | CUTANEOUS | Status: DC | PRN
  Filled 2015-05-29 (×2): qty 56

## 2015-05-29 MED ORDER — METHYLERGONOVINE MALEATE 0.2 MG/ML IJ SOLN: 0.2000 mg | INTRAMUSCULAR | Status: DC | PRN

## 2015-05-29 MED ORDER — ONDANSETRON HCL 4 MG PO TABS: 4.0000 mg | ORAL_TABLET | ORAL | Status: DC | PRN

## 2015-05-29 MED ORDER — DIBUCAINE 1 % RE OINT: 1.0000 "application " | TOPICAL_OINTMENT | RECTAL | Status: DC | PRN

## 2015-05-29 MED ORDER — METHYLERGONOVINE MALEATE 0.2 MG/ML IJ SOLN
0.2000 mg | Freq: Once | INTRAMUSCULAR | Status: AC
  Administered 2015-05-29: 0.2 mg via INTRAMUSCULAR

## 2015-05-29 MED ORDER — TETANUS-DIPHTH-ACELL PERTUSSIS 5-2.5-18.5 LF-MCG/0.5 IM SUSP: 0.5000 mL | Freq: Once | INTRAMUSCULAR | Status: DC

## 2015-05-29 MED ORDER — ZOLPIDEM TARTRATE 5 MG PO TABS: 5.0000 mg | ORAL_TABLET | Freq: Every evening | ORAL | Status: DC | PRN

## 2015-05-29 MED ORDER — METHYLERGONOVINE MALEATE 0.2 MG PO TABS: 0.2000 mg | ORAL_TABLET | ORAL | Status: DC | PRN

## 2015-05-29 MED ORDER — EPHEDRINE 5 MG/ML INJ
10.0000 mg | INTRAVENOUS | Status: DC | PRN
  Filled 2015-05-29: qty 2

## 2015-05-29 MED ORDER — OXYCODONE-ACETAMINOPHEN 5-325 MG PO TABS: 1.0000 | ORAL_TABLET | ORAL | Status: DC | PRN

## 2015-05-29 MED ORDER — WITCH HAZEL-GLYCERIN EX PADS: 1.0000 "application " | MEDICATED_PAD | CUTANEOUS | Status: DC | PRN

## 2015-05-29 MED ORDER — MEASLES, MUMPS & RUBELLA VAC ~~LOC~~ INJ
0.5000 mL | INJECTION | Freq: Once | SUBCUTANEOUS | Status: DC
  Filled 2015-05-29: qty 0.5

## 2015-05-29 MED ORDER — LACTATED RINGERS IV SOLN: 500.0000 mL | Freq: Once | INTRAVENOUS | Status: DC

## 2015-05-29 MED ORDER — METHYLERGONOVINE MALEATE 0.2 MG/ML IJ SOLN
INTRAMUSCULAR | Status: AC
  Administered 2015-05-29: 0.2 mg via INTRAMUSCULAR
  Filled 2015-05-29: qty 1

## 2015-05-29 MED ORDER — FLEET ENEMA 7-19 GM/118ML RE ENEM: 1.0000 | ENEMA | Freq: Every day | RECTAL | Status: DC | PRN

## 2015-05-29 MED ORDER — TERBUTALINE SULFATE 1 MG/ML IJ SOLN
0.2500 mg | Freq: Once | INTRAMUSCULAR | Status: DC | PRN
  Filled 2015-05-29: qty 1

## 2015-05-29 MED ORDER — DIPHENHYDRAMINE HCL 50 MG/ML IJ SOLN
12.5000 mg | INTRAMUSCULAR | Status: AC | PRN
  Administered 2015-05-29: 06:00:00 via INTRAVENOUS
  Administered 2015-05-29 (×2): 12.5 mg via INTRAVENOUS
  Filled 2015-05-29 (×2): qty 1

## 2015-05-29 MED ORDER — DOCUSATE SODIUM 100 MG PO CAPS
100.0000 mg | ORAL_CAPSULE | Freq: Two times a day (BID) | ORAL | Status: DC
  Administered 2015-05-29 – 2015-05-31 (×4): 100 mg via ORAL
  Filled 2015-05-29 (×4): qty 1

## 2015-05-29 MED ORDER — ACETAMINOPHEN 325 MG PO TABS: 650.0000 mg | ORAL_TABLET | ORAL | Status: DC | PRN

## 2015-05-29 MED ORDER — DIPHENHYDRAMINE HCL 25 MG PO CAPS: 25.0000 mg | ORAL_CAPSULE | Freq: Four times a day (QID) | ORAL | Status: DC | PRN

## 2015-05-29 MED ORDER — PRENATAL MULTIVITAMIN CH
1.0000 | ORAL_TABLET | Freq: Every day | ORAL | Status: DC
  Administered 2015-05-30: 1 via ORAL
  Filled 2015-05-29: qty 1

## 2015-05-29 MED ORDER — OXYCODONE-ACETAMINOPHEN 5-325 MG PO TABS: 2.0000 | ORAL_TABLET | ORAL | Status: DC | PRN

## 2015-05-29 MED ORDER — PHENYLEPHRINE 40 MCG/ML (10ML) SYRINGE FOR IV PUSH (FOR BLOOD PRESSURE SUPPORT)
80.0000 ug | PREFILLED_SYRINGE | INTRAVENOUS | Status: DC | PRN
  Filled 2015-05-29: qty 2

## 2015-05-29 MED ORDER — SIMETHICONE 80 MG PO CHEW: 80.0000 mg | CHEWABLE_TABLET | ORAL | Status: DC | PRN

## 2015-05-29 MED ORDER — IBUPROFEN 600 MG PO TABS
600.0000 mg | ORAL_TABLET | Freq: Four times a day (QID) | ORAL | Status: DC
  Administered 2015-05-29 – 2015-05-31 (×6): 600 mg via ORAL
  Filled 2015-05-29 (×7): qty 1

## 2015-05-29 MED ORDER — LANOLIN HYDROUS EX OINT
TOPICAL_OINTMENT | CUTANEOUS | Status: DC | PRN
Start: 2015-05-29 — End: 2015-05-31

## 2015-05-29 MED ORDER — ONDANSETRON HCL 4 MG/2ML IJ SOLN: 4.0000 mg | INTRAMUSCULAR | Status: DC | PRN

## 2015-05-29 MED ORDER — MISOPROSTOL 200 MCG PO TABS
ORAL_TABLET | ORAL | Status: AC
  Administered 2015-05-29: 800 ug via RECTAL
  Filled 2015-05-29: qty 4

## 2015-05-29 MED ORDER — LIDOCAINE HCL (PF) 1 % IJ SOLN
INTRAMUSCULAR | Status: DC | PRN
  Administered 2015-05-29: 6 mL
  Administered 2015-05-29: 4 mL

## 2015-05-29 MED ORDER — BISACODYL 10 MG RE SUPP: 10.0000 mg | Freq: Every day | RECTAL | Status: DC | PRN

## 2015-05-29 MED ORDER — FENTANYL 2.5 MCG/ML BUPIVACAINE 1/10 % EPIDURAL INFUSION (WH - ANES)
14.0000 mL/h | INTRAMUSCULAR | Status: DC | PRN
  Administered 2015-05-29 (×2): 14 mL/h via EPIDURAL
  Filled 2015-05-29 (×3): qty 125

## 2015-05-29 MED ORDER — MISOPROSTOL 200 MCG PO TABS
800.0000 ug | ORAL_TABLET | Freq: Once | ORAL | Status: AC
  Administered 2015-05-29: 800 ug via RECTAL

## 2015-05-29 MED ORDER — OXYTOCIN 10 UNIT/ML IJ SOLN
1.0000 m[IU]/min | INTRAVENOUS | Status: DC
  Administered 2015-05-29: 2 m[IU]/min via INTRAVENOUS
  Filled 2015-05-29: qty 10

## 2015-05-29 NOTE — Progress Notes (Signed)
[name] is a 25 y.o. G1P0000 at 5478w5d by LMP admitted for SOL.  Subjective: Patient has been using NO for pain during contractions with minimal relief of symptoms. Patient is now requesting an epidural.  Objective: BP 151/82 mmHg  Pulse 110  Temp(Src) 98.6 F (37 C) (Oral)  Resp 20  Ht 5\' 5"  (1.651 m)  Wt 75.751 kg (167 lb)  BMI 27.79 kg/m2  SpO2 99%  LMP 08/17/2014  FHT:  FHR: 140 bpm, variability: moderate,  accelerations:  Present,  decelerations:  Absent UC:   regular, every 2-3 minutes SVE:   Dilation: 6 Effacement (%): 90 Station: -1 Exam by:: [name], CN M  Labs: Lab Results  Component Value Date   WBC 10.3 05/28/2015   HGB 13.2 05/28/2015   HCT 37.5 05/28/2015   MCV 94.5 05/28/2015   PLT 250 05/28/2015   Assessment / Plan: Spontaneous labor, progressing normally  Labor: Progressing normally Fetal Wellbeing:  Category I Pain Control:  Nitrous Oxide, Epidural pending Anticipated MOD:  NSVD  Cephus Shellingndrew P Mathilde Mcwherter PA-S 05/29/2015, 12:27 AM

## 2015-05-29 NOTE — Progress Notes (Signed)
Patient ID: [name], female   DOB: 1990/07/24, 25 y.o.   MRN: [id number] [name] is a 25 y.o. G1P0000 at 7573w5d.  Subjective: No change in discomfort w/ UC's.   Objective: BP 151/82 mmHg  Pulse 110  Temp(Src) 98.6 F (37 C) (Oral)  Resp 20  Ht 5\' 5"  (1.651 m)  Wt 167 lb (75.751 kg)  BMI 27.79 kg/m2  SpO2 99%  LMP 08/17/2014   FHT:  FHR: 140 bpm, variability: mod,  accelerations:  15x15,  decelerations:  none UC:   Q 10 minutes, moderate  Dilation: 5 Effacement (%): 90 Cervical Position: Anterior Station: -2 Presentation: Vertex Exam by:: Ivonne AndrewV. Merick Kelleher, CNM AROM small amount of clear fluid  Labs: Results for orders placed or performed during the hospital encounter of 05/28/15 (from the past 24 hour(s))  CBC     Status: None   Collection Time: 05/28/15  6:00 PM  Result Value Ref Range   WBC 10.3 4.0 - 10.5 K/uL   RBC 3.97 3.87 - 5.11 MIL/uL   Hemoglobin 13.2 12.0 - 15.0 g/dL   HCT 40.937.5 81.136.0 - 91.446.0 %   MCV 94.5 78.0 - 100.0 fL   MCH 33.2 26.0 - 34.0 pg   MCHC 35.2 30.0 - 36.0 g/dL   RDW 78.213.5 95.611.5 - 21.315.5 %   Platelets 250 150 - 400 K/uL  Type and screen [facility] [place]     Status: None   Collection Time: 05/28/15  6:00 PM  Result Value Ref Range   ABO/RH(D) B POS    Antibody Screen NEG    Sample Expiration 05/31/2015   ABO/Rh     Status: None   Collection Time: 05/28/15  6:00 PM  Result Value Ref Range   ABO/RH(D) B POS     Assessment / Plan: 1173w5d week IUP Labor: Early Fetal Wellbeing:  Category ! Pain Control:  Planning Fentanyl or Nitrous  Anticipated MOD:  SVD  [place] Laken [place], CNM 05/28/2015 11:33 PM

## 2015-05-29 NOTE — Lactation Note (Signed)
This note was copied from a baby's chart. Lactation Consultation Note  Patient Name: Boy Lillia CorporalCourtney Strawser ZOXWR'UToday's Date: 05/29/2015 Reason for consult: Initial assessment Baby at 5 hr of life and mom has a lot of questions about supply. She wants to know how much baby will eat, when he will be full, and how much milk she is making. She stated she wants to offer formula along with the breast. Mom was encouraged to bf baby first then f/u with formula as needed. She was told the risks of formula feeding. Discussed baby behavior, feeding frequency, baby belly size, voids, wt loss, breast changes, and nipple care. Given lactation handouts. Aware of OP services and support group.    Maternal Data Has patient been taught Hand Expression?: Yes Does the patient have breastfeeding experience prior to this delivery?: No  Feeding Feeding Type: Breast Fed  LATCH Score/Interventions Latch: Repeated attempts needed to sustain latch, nipple held in mouth throughout feeding, stimulation needed to elicit sucking reflex. Intervention(s): Adjust position;Assist with latch;Breast compression  Audible Swallowing: Spontaneous and intermittent  Type of Nipple: Everted at rest and after stimulation  Comfort (Breast/Nipple): Soft / non-tender     Hold (Positioning): Full assist, staff holds infant at breast Intervention(s): Support Pillows;Position options  LATCH Score: 7  Lactation Tools Discussed/Used WIC Program: Yes   Consult Status Consult Status: Follow-up Date: 05/30/15 Follow-up type: In-patient    Rulon Eisenmengerlizabeth E Audry Pecina 05/29/2015, 8:55 PM

## 2015-05-29 NOTE — Anesthesia Procedure Notes (Signed)
Epidural Patient location during procedure: OB  Staffing Anesthesiologist: Sherrian DiversENENNY, Mirtie Bastyr  Preanesthetic Checklist Completed: patient identified, site marked, surgical consent, pre-op evaluation, timeout performed, IV checked, risks and benefits discussed and monitors and equipment checked  Epidural Patient position: sitting Prep: DuraPrep Patient monitoring: heart rate and blood pressure Approach: midline Location: L4-L5 Injection technique: LOR air  Needle:  Needle type: Tuohy  Needle gauge: 17 G Needle insertion depth: 6 cm Catheter type: closed end flexible Catheter size: 19 Gauge Catheter at skin depth: 11 cm Test dose: negative and Other  Assessment Events: blood not aspirated, injection not painful, no injection resistance, negative IV test and no paresthesia  Additional Notes Reason for block:procedure for pain

## 2015-05-29 NOTE — Progress Notes (Signed)
Patient ID: [name], female   DOB: 1991/01/25, 25 y.o.   MRN: [id number] [name] is a 25 y.o. G1P0000 at 561w5d.  Subjective: Comfortable w/ epidural.   Objective: BP 129/86 mmHg  Pulse 99  Temp(Src) 98.3 F (36.8 C) (Oral)  Resp 18  Ht 5\' 5"  (1.651 m)  Wt 167 lb (75.751 kg)  BMI 27.79 kg/m2  SpO2 100%  LMP 08/17/2014   FHT:  FHR: 125 bpm, variability: mod,  accelerations:  15x15,  decelerations:  Earlies UC:   Q 1-7 minutes, Moderate  Dilation: 6.5 Effacement (%): 90 Cervical Position: Middle Station: -1 Presentation: Vertex Exam by:: Genelle BalE. Johnson, RN   Labs: Results for orders placed or performed during the hospital encounter of 05/28/15 (from the past 24 hour(s))  CBC     Status: None   Collection Time: 05/28/15  6:00 PM  Result Value Ref Range   WBC 10.3 4.0 - 10.5 K/uL   RBC 3.97 3.87 - 5.11 MIL/uL   Hemoglobin 13.2 12.0 - 15.0 g/dL   HCT 29.537.5 28.436.0 - 13.246.0 %   MCV 94.5 78.0 - 100.0 fL   MCH 33.2 26.0 - 34.0 pg   MCHC 35.2 30.0 - 36.0 g/dL   RDW 44.013.5 10.211.5 - 72.515.5 %   Platelets 250 150 - 400 K/uL  Type and screen [facility] [place]     Status: None   Collection Time: 05/28/15  6:00 PM  Result Value Ref Range   ABO/RH(D) B POS    Antibody Screen NEG    Sample Expiration 05/31/2015   RPR     Status: None   Collection Time: 05/28/15  6:00 PM  Result Value Ref Range   RPR Ser Ql Non Reactive Non Reactive  ABO/Rh     Status: None   Collection Time: 05/28/15  6:00 PM  Result Value Ref Range   ABO/RH(D) B POS     Assessment / Plan: 821w5d week IUP Labor: Early-active Fetal Wellbeing:  Category I Pain Control:  Epidural Anticipated MOD:  SVD Start pitocin augmentation.   [place] Devlin Brink, CNM 05/29/2015 4:18 AM

## 2015-05-29 NOTE — Progress Notes (Signed)
[name] is a 25 y.o. G1P0000 at 5346w5d by LMP admitted for SOL.  Subjective: Patient is resting comfortably in her bed, feeling much better s/p epidural. No complaints at this time.  Objective: BP 122/78 mmHg  Pulse 82  Temp(Src) 98 F (36.7 C) (Oral)  Resp 20  Ht 5\' 5"  (1.651 m)  Wt 75.751 kg (167 lb)  BMI 27.79 kg/m2  SpO2 100%  LMP 08/17/2014  FHT:  FHR: 135 bpm, variability: moderate,  accelerations:  Present,  decelerations:  Absent UC:   irregular, every 5-6 minutes SVE:   Dilation: 7 Effacement (%): 90 Station: -1 Exam by:: Ivonne AndrewV. Smith, CNM   Labs: Lab Results  Component Value Date   WBC 10.3 05/28/2015   HGB 13.2 05/28/2015   HCT 37.5 05/28/2015   MCV 94.5 05/28/2015   PLT 250 05/28/2015    Assessment / Plan: Spontaneous labor, progressing normally  Labor: Progressing normally Fetal Wellbeing:  Category I Pain Control:  Epidural Anticipated MOD:  NSVD  Cephus ShellingAndrew P Haydn Cush PA-S 05/29/2015, 5:45 AM

## 2015-05-29 NOTE — Anesthesia Preprocedure Evaluation (Addendum)
Anesthesia Evaluation  Patient identified by MRN, date of birth, ID band Patient awake    Reviewed: Allergy & Precautions, NPO status , Patient's Chart, lab work & pertinent test results  Airway Mallampati: II  TM Distance: >3 FB Neck ROM: Full    Dental no notable dental hx.    Pulmonary asthma , former smoker,    Pulmonary exam normal breath sounds clear to auscultation       Cardiovascular negative cardio ROS Normal cardiovascular exam Rhythm:Regular Rate:Normal     Neuro/Psych negative neurological ROS  negative psych ROS   GI/Hepatic negative GI ROS, Neg liver ROS,   Endo/Other  negative endocrine ROS  Renal/GU negative Renal ROS  negative genitourinary   Musculoskeletal negative musculoskeletal ROS (+)   Abdominal   Peds negative pediatric ROS (+)  Hematology negative hematology ROS (+)   Anesthesia Other Findings   Reproductive/Obstetrics negative OB ROS                             Anesthesia Physical Anesthesia Plan  ASA: II  Anesthesia Plan: Epidural   Post-op Pain Management:    Induction: Intravenous  Airway Management Planned: Natural Airway  Additional Equipment:   Intra-op Plan:   Post-operative Plan:   Informed Consent: I have reviewed the patients History and Physical, chart, labs and discussed the procedure including the risks, benefits and alternatives for the proposed anesthesia with the patient or authorized representative who has indicated his/her understanding and acceptance.   Dental advisory given  Plan Discussed with: CRNA  Anesthesia Plan Comments: (Informed consent obtained prior to proceeding including risk of failure, 1% risk of PDPH, risk of minor discomfort and bruising.  Discussed rare but serious complications including epidural abscess, permanent nerve injury, epidural hematoma.  Discussed alternatives to epidural analgesia and patient  desires to proceed.  Timeout performed pre-procedure verifying patient name, procedure, and platelet count.  Patient tolerated procedure well. )        Anesthesia Quick Evaluation

## 2015-05-29 NOTE — Progress Notes (Signed)
[name] is a 25 y.o. G1P0000 at 1614w5d admitted for active labor  Subjective: Pt comfortable with epidural.  Family in room for support.  Objective: BP 140/95 mmHg  Pulse 90  Temp(Src) 98.8 F (37.1 C) (Oral)  Resp 18  Ht 5\' 5"  (1.651 m)  Wt 75.751 kg (167 lb)  BMI 27.79 kg/m2  SpO2 100%  LMP 08/17/2014      FHT:  FHR: 135 bpm, variability: moderate,  accelerations:  Present,  decelerations:  Present early UC:   regular, every 3-4 minutes SVE:   Dilation: 10 Effacement (%): 100 Station: +2 Exam by:: Cher NakaiKristin McLeod RN  Labs: Lab Results  Component Value Date   WBC 10.3 05/28/2015   HGB 13.2 05/28/2015   HCT 37.5 05/28/2015   MCV 94.5 05/28/2015   PLT 250 05/28/2015    Assessment / Plan: Augmentation of labor, progressing well  Labor: Pt complete, labored down x 1 hour, feeling pressure, plan to start pushing with RN at this time Preeclampsia:  n/a Fetal Wellbeing:  Category I Pain Control:  Epidural I/D:  n/a Anticipated MOD:  NSVD  [name], [name] 05/29/2015, 1:34 PM

## 2015-05-30 NOTE — Progress Notes (Signed)
UR chart review completed.  

## 2015-05-30 NOTE — Progress Notes (Signed)
POSTPARTUM PROGRESS NOTE  Post Partum Day 1 Subjective:  DULA HAVLIK is a 25 y.o. G1P1001 71w5ds/p SVD complicated by third degree tear during labor.  No acute events overnight.  Pt has no complaints.  Ambulating with assistance. Voiding and tolerating PO.  Pain is well controlled with ibuprofen. She has not had bowel movement. To receive MMR vaccine at 1000. Plans on breastfeeding for now, but will switch to bottle at home. No plan for birth control. Baby had elevated direct bilirubin of 0.8 mg/dL.  Objective: Blood pressure 118/85, pulse 89, temperature 98.3 F (36.8 C), temperature source Oral, resp. rate 16, height '5\' 5"'  (1.651 m), weight 75.751 kg (167 lb), last menstrual period 08/17/2014, SpO2 99 %, unknown if currently breastfeeding.  Physical Exam:  General: alert, cooperative and no distress Lochia:normal flow, appropriate Chest: CTAB Heart: RRR Abdomen:soft, nontender,  Uterine Fundus: firm, nontender. Displaced to the left U0. DVT Evaluation: No calf swelling or tenderness Extremities: no edema   Assessment/Plan:  ASSESSMENT: CJALYIAH SHELLEYis a 25y.o. G1P1001 448w5d/p SVD complicated by third degree tear.   1. No bowel movement - document BM before discharge due to third degree tear. 2. Third degree tear - discharge with prescription for percocet. If pain is tolerable default to ibuprofen.  Plan for discharge tomorrow. Mother could benefit from remaining additional day given that this is first child complicated by tear. Baby had elevated direct bilirubin, may delay mother discharge.   LOS: 2 days   Ahmed FaHarvel Quale/23/2017, 7:15 AM    CNM attestation Post Partum Day #1   CoALYSSHA HOUSHs a 239.0. G1P1001 s/p SVD.  Pt denies problems with ambulating, voiding or po intake. Pain is well controlled.  Plan for birth control is no method.  Method of Feeding: breast  PE:  BP 112/74 mmHg  Pulse 88  Temp(Src) 98.2 F (36.8 C) (Oral)  Resp 20  Ht  '5\' 5"'  (1.651 m)  Wt 75.751 kg (167 lb)  BMI 27.79 kg/m2  SpO2 100%  LMP 08/17/2014  Breastfeeding? Unknown Fundus firm  Plan for discharge: 05/31/15  SHSerita GrammesCNM 1:48 AM

## 2015-05-30 NOTE — Anesthesia Postprocedure Evaluation (Signed)
Anesthesia Post Note  Patient: Minerva FesterCourtney N Vanderheiden  Procedure(s) Performed: * No procedures listed *  Patient location during evaluation: Mother Baby Anesthesia Type: Epidural Level of consciousness: awake, awake and alert, oriented and patient cooperative Pain management: pain level controlled Vital Signs Assessment: post-procedure vital signs reviewed and stable Respiratory status: spontaneous breathing, nonlabored ventilation and respiratory function stable Cardiovascular status: stable Postop Assessment: no headache, no backache, patient able to bend at knees and no signs of nausea or vomiting Anesthetic complications: no    Last Vitals:  Filed Vitals:   05/29/15 2221 05/30/15 0548  BP: 123/65 118/85  Pulse: 93 89  Temp: 37.4 C 36.8 C  Resp: 18 16    Last Pain:  Filed Vitals:   05/30/15 0552  PainSc: 0-No pain                 Chundra Sauerwein L

## 2015-05-30 NOTE — Lactation Note (Signed)
This note was copied from a baby's chart. Lactation Consultation Note  Patient Name: [name] ZOXWR'UToday's Date: 05/30/2015 Reason for consult: Follow-up assessment  Baby is 24 hours old and has been to the breast several times but has taken a break since early this am.  LC changed a medium sized transitional stool prior to assisting with latch on the right breast , football position.  LC assisted with depth @ the breast , baby opened wide and sustained a deep latch for 15 mins with multiply swallows.  Increased with breast compressions and per mom comfortable the entire latch. Nipple sightly slanted when abby released  And LC had mom hand express and apply colostrum to nipple. Baby skin to skin on mom chest and very relaxed.  During consult reviewed breast feeding basics with mom , ( grandmother and friend present with moms permission ), and also  Asking BF questions.  LC discussed with mom it is normal for all babies to have sleepy times . During that time skin to skin is beneficial to increase hormones.  Mother informed of post-discharge support and given phone number to the lactation department, including services for phone call  assistance; out-patient appointments; and breastfeeding support group. List of other breastfeeding resources in the community given in the  handout. Encouraged mother to call for problems or concerns related to breastfeeding.   Maternal Data Has patient been taught Hand Expression?: Yes  Feeding Feeding Type: Breast Fed Length of feed: 15 min (multiply swallows noted, increased with breast compressions )  LATCH Score/Interventions Latch: Repeated attempts needed to sustain latch, nipple held in mouth throughout feeding, stimulation needed to elicit sucking reflex. Intervention(s): Adjust position;Assist with latch;Breast massage;Breast compression  Audible Swallowing: Spontaneous and intermittent  Type of Nipple: Everted at rest and after  stimulation  Comfort (Breast/Nipple): Soft / non-tender     Hold (Positioning): Assistance needed to correctly position infant at breast and maintain latch. Intervention(s): Breastfeeding basics reviewed;Support Pillows;Position options;Skin to skin  LATCH Score: 8  Lactation Tools Discussed/Used     Consult Status Consult Status: Follow-up Date: 05/31/15 Follow-up type: In-patient    Kathrin Greathouseorio, Arvella Massingale Ann 05/30/2015, 3:47 PM

## 2015-05-31 ENCOUNTER — Inpatient Hospital Stay (HOSPITAL_COMMUNITY): Admission: RE | Admit: 2015-05-31 | Payer: 59 | Source: Ambulatory Visit

## 2015-05-31 NOTE — Discharge Summary (Signed)
OB Discharge Summary     Patient Name: [name] DOB: 1990/04/11 MRN: [id number]  Date of admission: 05/28/2015 Delivering MD: Sharen Counter A   Date of discharge: 05/31/2015  Admitting diagnosis: 40WKS, CRAMPING REALLY BAD Intrauterine pregnancy: [redacted]w[redacted]d     Secondary diagnosis:  Active Problems:   Pregnancy  Additional problems:      Discharge diagnosis: Term Pregnancy Delivered                                                                                                Post partum procedures:None  Augmentation: Pitocin  Complications: PPH of . S/p cytotec and methergin x 2.  Hospital course:  Onset of Labor With Vaginal Delivery     25 y.o. yo G1P1001 at [redacted]w[redacted]d was admitted in Active Labor on 05/28/2015. Patient had an uncomplicated labor course as follows:  Membrane Rupture Time/Date: 10:16 PM ,05/28/2015   Intrapartum Procedures: Episiotomy: None [1]                                         Lacerations:  3rd degree [4];Labial [10]  which was repaired. Patient had a delivery of a Viable infant. 05/29/2015  Information for the patient's newborn:  Alayzia, Pavlock [[id number]]  Delivery Method: Vaginal, Spontaneous Delivery (Filed from Delivery Summary)    Pateint was noted to have increased blood loss of immediately after delivery.  Patient was given cytotec and methergin x 2.  Otherwise had an uncomplicated postpartum course.  She is ambulating, tolerating a regular diet, passing flatus, and urinating well. Patient is discharged home in stable condition on 05/31/2015.    Physical exam  Filed Vitals:   05/30/15 0548 05/30/15 1141 05/30/15 1840 05/31/15 0640  BP: 118/85 104/82 121/68 112/74  Pulse: 89  95 88  Temp: 98.3 F (36.8 C) 98.7 F (37.1 C) 98.7 F (37.1 C) 98.2 F (36.8 C)  TempSrc: Oral Oral Oral Oral  Resp: Height:      Weight:      SpO2:  100%     General: alert and cooperative Lochia: appropriate Uterine  Fundus: firm Incision: Healing well with no significant drainage DVT Evaluation: No evidence of DVT seen on physical exam. Labs: Lab Results  Component Value Date   WBC 10.3 05/28/2015   HGB 13.2 05/28/2015   HCT 37.5 05/28/2015   MCV 94.5 05/28/2015   PLT 250 05/28/2015   CMP 12/30/2006  Glucose 87  BUN 13  Sodium 141  Potassium 4.0  Chloride 104    Discharge instruction: per After Visit Summary and "Baby and Me Booklet".  After visit meds:    Medication List    ASK your doctor about these medications        calcium carbonate 500 MG chewable tablet  Commonly known as:  TUMS - dosed in mg elemental calcium  Chew 2 tablets by mouth daily.     FLINSTONES GUMMIES OMEGA-3 DHA PO  Take 2 each by  mouth daily.        Diet: routine diet  Activity: Advance as tolerated. Pelvic rest for 6 weeks.   Outpatient follow up:6 weeks Follow up Appt:No future appointments. Follow up Visit:No Follow-up on file.  Postpartum contraception: None  Newborn Data: Live born female  Birth Weight: 7 lb 0.7 oz (3195 g) APGAR: 8, 9  Baby Feeding: Breast Disposition:home with mother   05/31/2015 Michael J [place], MD   Patient was seen and examined by me also Vitals stable Labs stable Fundus firm, lochia within normal limits Perineum healing Ext WNL Continue care Ready for discharge  Aviva SignsMarie L Weston Kallman, CNM

## 2015-05-31 NOTE — Progress Notes (Signed)
MOB was referred for history of depression/anxiety.  Referral is screened out by Clinical Social Worker because none of the following criteria appear to apply: -History of anxiety/depression during this pregnancy, or of post-partum depression. - Diagnosis of anxiety and/or depression within last 3 years or -MOB's symptoms are currently being treated with medication and/or therapy.  CSW introduced self and reason for visit to MOB.  MOB denied prior history of depression, denied mental health complications during the pregnancy.  CSW provided brief education on perinatal mood and anxiety disorders, and MOB denied questions, concerns, or needs.   Please contact the Clinical Social Worker if needs arise or upon MOB request.  

## 2015-07-08 ENCOUNTER — Encounter (HOSPITAL_COMMUNITY): Payer: Self-pay

## 2015-07-08 ENCOUNTER — Ambulatory Visit: Payer: Self-pay

## 2015-07-08 ENCOUNTER — Emergency Department (HOSPITAL_COMMUNITY)
Admission: EM | Admit: 2015-07-08 | Discharge: 2015-07-08 | Discharge status: Against Medical Advice | Payer: Medicaid Other | Attending: Dermatology | Admitting: Dermatology

## 2015-07-08 DIAGNOSIS — R51 Headache: Secondary | ICD-10-CM | POA: Insufficient documentation

## 2015-07-08 DIAGNOSIS — J45909 Unspecified asthma, uncomplicated: Secondary | ICD-10-CM | POA: Insufficient documentation

## 2015-07-08 DIAGNOSIS — Z5321 Procedure and treatment not carried out due to patient leaving prior to being seen by health care provider: Secondary | ICD-10-CM | POA: Diagnosis not present

## 2015-07-08 DIAGNOSIS — R509 Fever, unspecified: Secondary | ICD-10-CM | POA: Insufficient documentation

## 2015-07-08 DIAGNOSIS — R21 Rash and other nonspecific skin eruption: Secondary | ICD-10-CM | POA: Diagnosis not present

## 2015-07-08 DIAGNOSIS — R05 Cough: Secondary | ICD-10-CM | POA: Diagnosis not present

## 2015-07-08 DIAGNOSIS — Z87891 Personal history of nicotine dependence: Secondary | ICD-10-CM | POA: Diagnosis not present

## 2015-07-08 NOTE — Patient Instructions (Signed)
     IF you received an x-ray today, you will receive an invoice from [facility]. Please contact [facility] at [phone] with questions or concerns regarding your invoice.   IF you received labwork today, you will receive an invoice from [facility]. Please contact [facility] at [phone] with questions or concerns regarding your invoice.   Our billing staff will not be able to assist you with questions regarding bills from these companies.  You will be contacted with the lab results as soon as they are available. The fastest way to get your results is to activate your My Chart account. Instructions are located on the last page of this paperwork. If you have not heard from us regarding the results in 2 weeks, please contact this office.      

## 2015-07-08 NOTE — ED Notes (Signed)
Patient c/o intermittent fever, productive cough, and headache x 1 week. Patient states she has been having green sputum.

## 2016-05-25 ENCOUNTER — Telehealth: Payer: Self-pay | Admitting: General Practice

## 2016-05-25 NOTE — Telephone Encounter (Signed)
APT. REMINDER CALL, LMTCB °

## 2016-05-26 ENCOUNTER — Ambulatory Visit (INDEPENDENT_AMBULATORY_CARE_PROVIDER_SITE_OTHER): Payer: Self-pay | Admitting: Internal Medicine

## 2016-05-26 DIAGNOSIS — Z0189 Encounter for other specified special examinations: Secondary | ICD-10-CM

## 2016-05-26 DIAGNOSIS — Z8249 Family history of ischemic heart disease and other diseases of the circulatory system: Secondary | ICD-10-CM

## 2016-05-26 DIAGNOSIS — Z8742 Personal history of other diseases of the female genital tract: Secondary | ICD-10-CM

## 2016-05-26 DIAGNOSIS — R87619 Unspecified abnormal cytological findings in specimens from cervix uteri: Secondary | ICD-10-CM | POA: Insufficient documentation

## 2016-05-26 DIAGNOSIS — R87612 Low grade squamous intraepithelial lesion on cytologic smear of cervix (LGSIL): Secondary | ICD-10-CM

## 2016-05-26 DIAGNOSIS — Z0000 Encounter for general adult medical examination without abnormal findings: Secondary | ICD-10-CM | POA: Insufficient documentation

## 2016-05-26 DIAGNOSIS — Z820 Family history of epilepsy and other diseases of the nervous system: Secondary | ICD-10-CM

## 2016-05-26 DIAGNOSIS — Z836 Family history of other diseases of the respiratory system: Secondary | ICD-10-CM

## 2016-05-26 DIAGNOSIS — J069 Acute upper respiratory infection, unspecified: Secondary | ICD-10-CM

## 2016-05-26 DIAGNOSIS — Z833 Family history of diabetes mellitus: Secondary | ICD-10-CM

## 2016-05-26 NOTE — Assessment & Plan Note (Signed)
Discussed influenza vaccine. Patient declined.

## 2016-05-26 NOTE — Progress Notes (Signed)
Internal Medicine Clinic Attending  Case discussed with Dr. Ahmed at the time of the visit.  We reviewed the resident's history and exam and pertinent patient test results.  I agree with the assessment, diagnosis, and plan of care documented in the resident's note. 

## 2016-05-26 NOTE — Assessment & Plan Note (Signed)
Had abnormal Pap smear 1 year ago at the [facility] de[facility] LGIL +HPV. I don't have this record and not sure what follow up was done. Patient is asymptomatic.  -obtain records from the health department to further manage this.

## 2016-05-26 NOTE — Patient Instructions (Signed)
We will obtain records from the health department about your pap smear.  Use NetiPot to clean your sinuses.

## 2016-05-26 NOTE — Assessment & Plan Note (Signed)
Has runny nose and nasal congestion x3 days. Exam is normal. No other complaints.  Supportive care with NetiPot, salt water nasal sprays.

## 2016-05-26 NOTE — Progress Notes (Signed)
CC: establish care as a new patient with complaint of cough/cold/ runny nose   HPI: [name] is a 26 y.o. Who is here to establish care as a new patient with the complain of URI symptoms.   Used to go the health dept for her care, her grandmother is a patient here who referred her here. She has been very healthy overall. Had asthma as a child but does not bother her anymore. No other medical problems. Has a one year old son, healthy pregnancy, no problems since pregnancy. Periods are regular. Single currently and unemployed. Denies any smoking, occasional alcohol use, no drugs use.   Had abnormal pap smear 1 year ago at the health de[facility], record states LGIL +HPV. I don't have a copy of the records. We will need to obtain this. No abnormal bleeding, no vaginal discharge.  Traveled from [place] 3 days ago, has had runny nose and nasal congestion since then but no fever, sore throat, or any other complaints.    Past Medical History:  Diagnosis Date  . Allergy   . Asthma   . Vaginal Pap smear, abnormal    +HPV, LSIL   Past Surgical History:  Procedure Laterality Date  . WISDOM TOOTH EXTRACTION Bilateral 2010   Social History   Social History  . Marital status: Single    Spouse name: N/A  . Number of children: 1  . Years of education: N/A   Social History Main Topics  . Smoking status: Never Smoker  . Smokeless tobacco: Never Used  . Alcohol use No  . Drug use: No  . Sexual activity: No   Other Topics Concern  . None   Social History Narrative  . None   Family History  Problem Relation Age of Onset  . COPD Mother   . Seizures Brother   . Diabetes Paternal Grandmother   . Hypertension Paternal Grandmother   . Diabetes Paternal Grandfather   . Hypertension Paternal Grandfather   . Diabetes Paternal Aunt   . Diabetes Paternal Uncle   . Cancer Neg Hx     Review of Systems:   Review of Systems  Constitutional: Negative for chills and fever.  HENT:  Positive for congestion. Negative for ear discharge and ear pain.   Respiratory: Negative for cough, hemoptysis and sputum production.   Cardiovascular: Negative for chest pain and palpitations.  Gastrointestinal: Negative for abdominal pain, heartburn, nausea and vomiting.  Genitourinary: Negative for dysuria and urgency.  Musculoskeletal: Negative for myalgias.  Neurological: Negative for dizziness and weakness.     Physical Exam:  Vitals:   05/26/16 0931  BP: 117/70  Pulse: 87  Temp: 98.9 F (37.2 C)  TempSrc: Oral  SpO2: 100%  Weight: 145 lb 14.4 oz (66.2 kg)  Height: 5' 5.5" (1.664 m)   Physical Exam  Constitutional: She is oriented to person, place, and time. She appears well-developed and well-nourished. No distress.  HENT:  Head: Normocephalic and atraumatic.  Eyes: Conjunctivae are normal. Right eye exhibits no discharge. Left eye exhibits no discharge.  Cardiovascular: Exam reveals no gallop and no friction rub.   No murmur heard. Respiratory: Effort normal and breath sounds normal. No respiratory distress. She has no wheezes.  GI: Soft. Bowel sounds are normal. She exhibits no distension. There is no tenderness.  Musculoskeletal: Normal range of motion. She exhibits no edema or deformity.  Neurological: She is alert and oriented to person, place, and time. No cranial nerve deficit.  Skin: She is  not diaphoretic.  No rashes. Has tattoos.   Psychiatric: She has a normal mood and affect.    Assessment & Plan:   See Encounters Tab for problem based charting.  Patient discussed with Dr. Cleda DaubE. Hoffman

## 2016-08-08 ENCOUNTER — Emergency Department (HOSPITAL_COMMUNITY): Payer: Medicaid Other

## 2016-08-08 ENCOUNTER — Encounter (HOSPITAL_COMMUNITY): Payer: Self-pay | Admitting: Emergency Medicine

## 2016-08-08 ENCOUNTER — Emergency Department (HOSPITAL_COMMUNITY)
Admission: EM | Admit: 2016-08-08 | Discharge: 2016-08-08 | Disposition: A | Discharge status: Home / Self Care | Payer: Medicaid Other | Attending: Emergency Medicine | Admitting: Emergency Medicine

## 2016-08-08 DIAGNOSIS — J4520 Mild intermittent asthma, uncomplicated: Secondary | ICD-10-CM | POA: Insufficient documentation

## 2016-08-08 DIAGNOSIS — R062 Wheezing: Secondary | ICD-10-CM

## 2016-08-08 DIAGNOSIS — R0602 Shortness of breath: Secondary | ICD-10-CM | POA: Diagnosis present

## 2016-08-08 MED ORDER — VALVED HOLDING CHAMBER DEVI: 1.0000 | 0 refills | Status: DC | PRN

## 2016-08-08 MED ORDER — IPRATROPIUM-ALBUTEROL 0.5-2.5 (3) MG/3ML IN SOLN
3.0000 mL | Freq: Once | RESPIRATORY_TRACT | Status: AC
  Administered 2016-08-08: 3 mL via RESPIRATORY_TRACT
  Filled 2016-08-08: qty 3

## 2016-08-08 MED ORDER — METHYLPREDNISOLONE 4 MG PO TBPK: ORAL_TABLET | ORAL | 0 refills | Status: DC

## 2016-08-08 MED ORDER — METHYLPREDNISOLONE SODIUM SUCC 125 MG IJ SOLR
125.0000 mg | Freq: Once | INTRAMUSCULAR | Status: AC
  Administered 2016-08-08: 125 mg via INTRAMUSCULAR
  Filled 2016-08-08: qty 2

## 2016-08-08 MED ORDER — ALBUTEROL SULFATE HFA 108 (90 BASE) MCG/ACT IN AERS: 1.0000 | INHALATION_SPRAY | Freq: Four times a day (QID) | RESPIRATORY_TRACT | 0 refills | Status: DC | PRN

## 2016-08-08 NOTE — Discharge Instructions (Signed)
Please return to the ED or seek additional medical care if your symptoms worsen or do not improve.  Please obtain a primary care doctor and follow up with them.

## 2016-08-08 NOTE — ED Provider Notes (Signed)
[facility]-EMERGENCY DEPT Provider Note   CSN: [id number] Arrival date & time: 08/08/16  [id number]     History   Chief Complaint Chief Complaint  Patient presents with  . Asthma    HPI [name] is a 26 y.o. female who presents with shortness of breath.  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 She has a remote history of asthma, states that it only bothers her she runs, or when it is hot out.  She says that yesterday her roommate told her she sound" like she was wheezing."  Patient says it feels like she just can't "grab onto a breath."  Patient previously had an albuterol inhaler however reports that she has not needed one for years.  She reports that she normally has seasonal allergies, with postnasal drip, nasal congestion. She has been taking a over-the-counter allergy medication, does not know which, and reports mild relief.    HPI  Past Medical History:  Diagnosis Date  . Allergy   . Asthma   . Vaginal Pap smear, abnormal    +HPV, LSIL    Patient Active Problem List   Diagnosis Date Noted  . Abnormal Pap smear of cervix 05/26/2016  . Viral URI 05/26/2016  . Healthcare maintenance 05/26/2016  . 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 WISDOM TOOTH EXTRACTION Bilateral 2010    OB History    Gravida Para Term Preterm AB Living   1 1 1  0 0 1   SAB TAB Ectopic Multiple Live Births   0 0 0 0 1       Home Medications    Prior to Admission medications   Medication Sig Start Date End Date Taking? Authorizing Provider  albuterol (PROVENTIL HFA;VENTOLIN HFA) 108 (90 Base) MCG/ACT inhaler Inhale 1-2 puffs into the lungs every 6 (six) hours as needed for wheezing or shortness of breath. 08/08/16   Cristina GongHammond, Elizabeth W, PA-C  methylPREDNISolone (MEDROL DOSEPAK) 4 MG TBPK tablet Take all  tablets at breakfast with food. Day one: take 6 tablets Day two: take 5 tablets Day three: take 4 tablets Day four:take 3 tablets Day five: take two tablets Day six: take one tablet 08/08/16   Cristina GongHammond, Elizabeth W, PA-C  Spacer/Aero-Holding Chambers (VALVED HOLDING CHAMBER) DEVI 1 Device by Does not apply route as needed. 08/08/16   Cristina GongHammond, Elizabeth W, PA-C    Family History Family History  Problem Relation Age of Onset  . COPD Mother   . Hypertension Father   . Seizures Brother   . Diabetes Paternal Grandmother   . Hypertension Paternal Grandmother   . Diabetes Paternal Grandfather   . Hypertension Paternal Grandfather   . Diabetes Paternal Aunt   . Diabetes Paternal Uncle   . Cancer Neg Hx     Social History Social History  Substance Use Topics  . Smoking status: Never Smoker  . Smokeless tobacco: Never Used  . Alcohol use No     Allergies   Banana and Orange fruit [citrus]   Review of Systems Review of Systems  Constitutional: Negative for chills, fatigue and fever.  HENT: Positive for postnasal drip and rhinorrhea. Negative for ear pain and sore throat.   Eyes: Negative for pain and visual disturbance.  Respiratory: Positive for shortness of breath and wheezing. Negative for cough and chest  tightness.   Cardiovascular: Negative for chest pain and palpitations.  Gastrointestinal: Negative for abdominal pain and vomiting.  Genitourinary: Negative for dysuria and hematuria.  Musculoskeletal: Negative for arthralgias and back pain.  Skin: Negative for color change and rash.  Neurological: Negative for seizures and syncope.  All other systems reviewed and are negative.    Physical Exam Updated Vital Signs BP 114/76 (BP Location: Left Arm)   Pulse 74   Temp 98.2 F (36.8 C) (Oral)   Resp 16   Ht 5\' 5"  (1.651 m)   Wt 65.8 kg (145 lb)   LMP 08/02/2016 (Approximate)   SpO2 100%   BMI 24.13 kg/m   Physical Exam  Constitutional: She appears well-developed  and well-nourished. No distress.  HENT:  Head: Normocephalic and atraumatic.  Eyes: Conjunctivae are normal. Right eye exhibits no discharge. Left eye exhibits no discharge. No scleral icterus.  Neck: Normal range of motion. No JVD present.  Cardiovascular: Normal rate, regular rhythm and normal heart sounds.   No murmur heard. Pulmonary/Chest: Effort normal. No accessory muscle usage or stridor. No respiratory distress. She has no decreased breath sounds. She has wheezes (Faint wheezes bilateral lower lobes). She has no rhonchi. She has no rales. She exhibits no tenderness.  Abdominal: Soft. Bowel sounds are normal. She exhibits no distension. There is no tenderness. There is no guarding.  Musculoskeletal: She exhibits no edema or deformity.  Neurological: She is alert. No sensory deficit. She exhibits normal muscle tone.  Skin: Skin is warm and dry. She is not diaphoretic.  Psychiatric: She has a normal mood and affect. Her behavior is normal.  Nursing note and vitals reviewed.    ED Treatments / Results  Labs (all labs ordered are listed, but only abnormal results are displayed) Labs Reviewed - No data to display  EKG  EKG Interpretation None       Radiology Dg Chest 2 View  Result Date: 08/08/2016 CLINICAL DATA:  Generalize shortness of breath over the last 2 weeks EXAM: CHEST  2 VIEW COMPARISON:  12/30/2006 FINDINGS: Heart size is normal. Mediastinal shadows are normal. The lungs are clear. No bronchial thickening. No infiltrate, mass, effusion or collapse. Pulmonary vascularity is normal. No bony abnormality. IMPRESSION: Normal chest Electronically Signed   By: Paulina Fusi M.D.   On: 08/08/2016 07:57    Procedures Procedures (including critical care time)  Medications Ordered in ED Medications  ipratropium-albuterol (DUONEB) 0.5-2.5 (3) MG/3ML nebulizer solution 3 mL (3 mLs Nebulization Given 08/08/16 0757)  methylPREDNISolone sodium succinate (SOLU-MEDROL) 125 mg/2 mL  injection 125 mg (125 mg Intramuscular Given 08/08/16 0757)     Initial Impression / Assessment and Plan / ED Course  I have reviewed the triage vital signs and the nursing notes.  Pertinent labs & imaging results that were available during my care of the patient were reviewed by me and considered in my medical decision making (see chart for details).  Clinical Course as of Aug 08 940  Sat Aug 08, 2016  0820 Re-check patient says she is breathing much better, feels jittery from albuterol.  Ready for discharge.   [EH]    Clinical Course User Index [EH] Cristina Gong, PA-C   No current signs of respiratory distress. Lung exam improved after nebulizer treatment. Prednisone given in the ED and pt will bd dc with 5 day burst, albuterol inhaler and holding chamber. Pt states they are breathing at baseline. Pt has been instructed to continue using prescribed medications and to  speak with PCP about today's exacerbation.   She has also been instructed to start taking Allegra, Zyrtec, or Claritin daily for her allergies.   At this time there does not appear to be any evidence of an acute emergency medical condition and the patient appears stable for discharge with appropriate outpatient follow up.Diagnosis was discussed with patient who verbalizes understanding and is agreeable to discharge. Pt case discussed with Dr. Clarene Duke who agrees with my plan.    Final Clinical Impressions(s) / ED Diagnoses   Final diagnoses:  Wheezing  Mild intermittent asthma without complication    New Prescriptions Discharge Medication List as of 08/08/2016  8:30 AM    START taking these medications   Details  albuterol (PROVENTIL HFA;VENTOLIN HFA) 108 (90 Base) MCG/ACT inhaler Inhale 1-2 puffs into the lungs every 6 (six) hours as needed for wheezing or shortness of breath., Starting Sat 08/08/2016, Print    methylPREDNISolone (MEDROL DOSEPAK) 4 MG TBPK tablet Take all tablets at breakfast with food. Day one:  take 6 tablets Day two: take 5 tablets Day three: take 4 tablets Day four:take 3 tablets Day five: take two tablets Day six: take one tablet, Print    Spacer/Aero-Holding Chambers (VALVED HOLDING CHAMBER) DEVI 1 Device by Does not apply route as needed., Starting Sat 08/08/2016, Print         Cristina Gong, PA-C 08/08/16 0944    Little, Ambrose Finland, MD 08/09/16 [phone]

## 2016-08-08 NOTE — ED Triage Notes (Signed)
Pt states she has asthma and it has been bothering for about a week but states it has been worse for the past 3 days   Pt states she does not have an inhaler at this time

## 2016-08-08 NOTE — ED Notes (Signed)
Bed: [facility] Expected date:  Expected time:  Means of arrival:  Comments: 

## 2017-12-20 ENCOUNTER — Other Ambulatory Visit: Payer: Self-pay

## 2017-12-25 ENCOUNTER — Encounter (HOSPITAL_COMMUNITY): Payer: Self-pay | Admitting: *Deleted

## 2017-12-25 ENCOUNTER — Emergency Department (HOSPITAL_COMMUNITY)
Admission: EM | Admit: 2017-12-25 | Discharge: 2017-12-25 | Disposition: A | Discharge status: Home / Self Care | Payer: Self-pay | Attending: Emergency Medicine | Admitting: Emergency Medicine

## 2017-12-25 DIAGNOSIS — M545 Low back pain: Secondary | ICD-10-CM | POA: Insufficient documentation

## 2017-12-25 DIAGNOSIS — M7918 Myalgia, other site: Secondary | ICD-10-CM | POA: Insufficient documentation

## 2017-12-25 DIAGNOSIS — R6889 Other general symptoms and signs: Secondary | ICD-10-CM

## 2017-12-25 DIAGNOSIS — J029 Acute pharyngitis, unspecified: Secondary | ICD-10-CM | POA: Insufficient documentation

## 2017-12-25 DIAGNOSIS — R509 Fever, unspecified: Secondary | ICD-10-CM | POA: Insufficient documentation

## 2017-12-25 LAB — URINALYSIS, ROUTINE W REFLEX MICROSCOPIC
BILIRUBIN URINE: NEGATIVE
GLUCOSE, UA: NEGATIVE mg/dL
KETONES UR: 5 mg/dL — AB
NITRITE: NEGATIVE
PROTEIN: 30 mg/dL — AB
Specific Gravity, Urine: 1.029 (ref 1.005–1.030)
pH: 5 (ref 5.0–8.0)

## 2017-12-25 LAB — POC URINE PREG, ED: Preg Test, Ur: NEGATIVE

## 2017-12-25 LAB — GROUP A STREP BY PCR: Group A Strep by PCR: NOT DETECTED

## 2017-12-25 MED ORDER — ACETAMINOPHEN 500 MG PO TABS
1000.0000 mg | ORAL_TABLET | Freq: Once | ORAL | Status: AC | PRN
  Administered 2017-12-25: 1000 mg via ORAL
  Filled 2017-12-25: qty 2

## 2017-12-25 MED ORDER — ACETAMINOPHEN 500 MG PO TABS: 1000.0000 mg | ORAL_TABLET | Freq: Once | ORAL | Status: DC

## 2017-12-25 NOTE — Discharge Instructions (Signed)
Be sure you are drinking plenty of fluids so you do not get dehydrated. Follow up with [facility] or return here for worsening symptoms. Take tylenol and ibuprofen as needed for fever and pain.

## 2017-12-25 NOTE — ED Provider Notes (Signed)
[facility]-EMERGENCY DEPT Provider Note   CSN: [id number] Arrival date & time: 12/25/17  1810     History   Chief Complaint Chief Complaint  Patient presents with  . Sore Throat  . Generalized Body Aches    HPI [name] is a 27 y.o. female who presents to the ED with c/o sore throat, low back pain and generalized body aches that started last night. Patient denies cough or congestion. Patient endorses exposure to her child that has an ear infection and URI.  HPI  Past Medical History:  Diagnosis Date  . Allergy   . Asthma   . Vaginal Pap smear, abnormal    +HPV, LSIL    Patient Active Problem List   Diagnosis Date Noted  . Abnormal Pap smear of cervix 05/26/2016  . Viral URI 05/26/2016  . Healthcare maintenance 05/26/2016  . 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 WISDOM TOOTH EXTRACTION Bilateral 2010     OB History    Gravida  1   Para  1   Term  1   Preterm  0   AB  0   Living  1     SAB  0   TAB  0   Ectopic  0   Multiple  0   Live Births  1            Home Medications    Prior to Admission medications   Medication Sig Start Date End Date Taking? Authorizing Provider  albuterol (PROVENTIL HFA;VENTOLIN HFA) 108 (90 Base) MCG/ACT inhaler Inhale 1-2 puffs into the lungs every 6 (six) hours as needed for wheezing or shortness of breath. 08/08/16   Cristina Gong, PA-C  methylPREDNISolone (MEDROL DOSEPAK) 4 MG TBPK tablet Take all tablets at breakfast with food. Day one: take 6 tablets Day two: take 5 tablets Day three: take 4 tablets Day four:take 3 tablets Day five: take two tablets Day six: take one tablet 08/08/16   Cristina Gong, PA-C  Spacer/Aero-Holding Chambers (VALVED HOLDING CHAMBER) DEVI 1 Device by Does not apply route as needed. 08/08/16   Cristina Gong, PA-C    Family History Family History  Problem  Relation Age of Onset  . COPD Mother   . Hypertension Father   . Seizures Brother   . Diabetes Paternal Grandmother   . Hypertension Paternal Grandmother   . Diabetes Paternal Grandfather   . Hypertension Paternal Grandfather   . Diabetes Paternal Aunt   . Diabetes Paternal Uncle   . Cancer Neg Hx     Social History Social History   Tobacco Use  . Smoking status: Never Smoker  . Smokeless tobacco: Never Used  Substance Use Topics  . Alcohol use: No  . Drug use: No     Allergies   Banana and Orange fruit [citrus]   Review of Systems Review of Systems  Constitutional: Positive for chills and fever.  HENT: Positive for sore throat.   Eyes: Negative for discharge and redness.  Respiratory: Negative for cough and shortness of breath.   Cardiovascular: Negative for chest pain.  Gastrointestinal: Negative for abdominal pain, nausea and vomiting.  Genitourinary: Negative for dysuria, flank pain and frequency.  Musculoskeletal: Positive for back pain and myalgias.  Skin: Negative for rash.  Neurological: Negative for headaches.  Hematological: Negative for adenopathy.  Psychiatric/Behavioral: Negative for confusion.     Physical Exam Updated Vital  Signs BP 102/70 (BP Location: Left Arm)   Pulse 72   Temp 100.1 F (37.8 C) (Oral)   Resp 19   SpO2 100%   Physical Exam  Constitutional: She appears well-developed and well-nourished. No distress.  HENT:  Head: Normocephalic and atraumatic.  Right Ear: Tympanic membrane normal.  Left Ear: Tympanic membrane normal.  Nose: Nose normal.  Mouth/Throat: Uvula is midline and mucous membranes are normal. Posterior oropharyngeal erythema present. No oropharyngeal exudate, posterior oropharyngeal edema or tonsillar abscesses.  Eyes: EOM are normal.  Neck: Normal range of motion. Neck supple.  Cardiovascular: Normal rate and regular rhythm.  Pulmonary/Chest: Effort normal and breath sounds normal.  Abdominal: Soft. Bowel  sounds are normal. There is no tenderness.  Musculoskeletal: Normal range of motion.       Lumbar back: She exhibits tenderness.       Back:  Lymphadenopathy:    She has no cervical adenopathy.  Neurological: She is alert.  Skin: Skin is warm and dry.  Psychiatric: She has a normal mood and affect.  Nursing note and vitals reviewed.    ED Treatments / Results  Labs (all labs ordered are listed, but only abnormal results are displayed) Labs Reviewed  URINALYSIS, ROUTINE W REFLEX MICROSCOPIC - Abnormal; Notable for the following components:      Result Value   APPearance HAZY (*)    Hgb urine dipstick LARGE (*)    Ketones, ur 5 (*)    Protein, ur 30 (*)    Leukocytes, UA TRACE (*)    Bacteria, UA RARE (*)    All other components within normal limits  GROUP A STREP BY PCR  URINE CULTURE  POC URINE PREG, ED    Radiology No results found.  Procedures Procedures (including critical care time)  Medications Ordered in ED Medications  acetaminophen (TYLENOL) tablet 1,000 mg (1,000 mg Oral Given 12/25/17 1951)     Initial Impression / Assessment and Plan / ED Course  I have reviewed the triage vital signs and the nursing notes. Pt afebrile without tonsillar exudate, negative strep. Presents with no cervical lymphadenopathy, positive dysphagia; diagnosis of viral pharyngitis. No abx indicated. Discharged with symptomatic tx for pain  Pt does not appear dehydrated, but did discuss importance of water rehydration. Presentation non concerning for PTA or RPA. No trismus or uvula deviation. Specific return precautions discussed. Pt able to drink water in ED without difficulty with intact air way. Recommended PCP follow up.  Final Clinical Impressions(s) / ED Diagnoses   Final diagnoses:  Flu-like symptoms    ED Discharge Orders    None       Kerrie [place], [place] 12/25/17 2101    Tegeler, Canary Brim, MD 12/25/17 2318

## 2017-12-25 NOTE — ED Triage Notes (Signed)
Pt complains of sore throat, lower back pain, generalized body aches since last night.

## 2017-12-27 LAB — URINE CULTURE: CULTURE: NO GROWTH

## 2017-12-27 LAB — CYTOLOGY - PAP: DIAGNOSIS: NEGATIVE

## 2018-03-09 NOTE — L&D Delivery Note (Signed)
Patient: [name] MRN: [id number]  GBS status: Positive, IAP given: PCN   Patient is a 28 y.o. now G2P2 s/p NSVD at [redacted]w[redacted]d, who was admitted for IOL for postdates. SROM 5h 48m prior to delivery with clear fluid.    Delivery Note At 11:42 PM a viable female was delivered via Vaginal, Spontaneous (Presentation: LOA).  APGAR: 8, 9; weight pending.   Placenta status: spontaneous, intact.  Cord: 3 vessel with the following complications: nuchal x1.   Anesthesia:  Epidural  Episiotomy: None Lacerations: 2nd degree;Vaginal;Labial Suture Repair: 3.0 vicryl rapide Est. Blood Loss (mL): 450   Head delivered LOA. Nuchal cord x1 present, reduced at perineum. Shoulder and body delivered in usual fashion. Infant with spontaneous cry, placed on mother's abdomen, dried and bulb suctioned. Cord clamped x 2 after 1-minute delay, and cut by family member. Cord blood drawn. Placenta delivered spontaneously with gentle cord traction. Fundus firm with massage and Pitocin. Perineum and vagina inspected and found to have 2nd degree laceration and right labial laceration, which were repaired with 3.0 vicryl rapide with good hemostasis achieved. Despite firm fundus, noted to have persistent trickle of bleeding. Red rubber inserted due to urine expression with fundal massage, drained approximately 50 cc of clear urine. Sweep performed yielding a few clots in lower uterine segment. TXA given. Bleeding within normal limits.   Mom to postpartum.  Baby to Couplet care / Skin to Skin.  Melina Schools 11/09/2018, 12:12 AM

## 2018-03-30 ENCOUNTER — Ambulatory Visit (INDEPENDENT_AMBULATORY_CARE_PROVIDER_SITE_OTHER): Payer: Self-pay | Admitting: General Practice

## 2018-03-30 ENCOUNTER — Encounter: Payer: Self-pay | Admitting: Family Medicine

## 2018-03-30 DIAGNOSIS — Z3201 Encounter for pregnancy test, result positive: Secondary | ICD-10-CM

## 2018-03-30 DIAGNOSIS — Z3490 Encounter for supervision of normal pregnancy, unspecified, unspecified trimester: Secondary | ICD-10-CM

## 2018-03-30 LAB — POCT PREGNANCY, URINE: PREG TEST UR: POSITIVE — AB

## 2018-03-30 NOTE — Progress Notes (Addendum)
Patient presents to office today for UPT. UPT +. Patient reports first positive home test on Sunday. LMP 01/24/18. Patient reports history of very irregular periods. Patient reports feeling breast tenderness, fatigue & nausea for about 3 weeks now. EDD 10/31/18 [redacted]w[redacted]d by LMP. Will schedule u/s appt to confirm dating. Scheduled 1/27 @ 830. Patient denies taking any meds/vitamins. Recommended she begin prenatal vitamins & start OB care. Patient verbalized understanding & asked what she can do for nausea. Patient states she doesn't throw up, it's just constant nausea. Recommended trying ginger tea, using sea bands, eating a couple crackers in the morning before she gets out of bed, or vitamin b6. Patient verbalized understanding and had no questions.  Chase Caller RN BSN 03/30/18   Reviewed the note and I agree with the plan of care.  Nolene Bernheim, RN, MSN, NP-BC Nurse Practitioner, [facility] for [employer], [facility] 03/30/2018 11:53 AM

## 2018-04-04 ENCOUNTER — Ambulatory Visit (INDEPENDENT_AMBULATORY_CARE_PROVIDER_SITE_OTHER): Payer: Self-pay | Admitting: General Practice

## 2018-04-04 ENCOUNTER — Ambulatory Visit (HOSPITAL_COMMUNITY)
Admission: RE | Admit: 2018-04-04 | Discharge: 2018-04-04 | Disposition: A | Discharge status: Home / Self Care | Payer: Medicaid Other | Source: Ambulatory Visit | Attending: Nurse Practitioner | Admitting: Nurse Practitioner

## 2018-04-04 ENCOUNTER — Other Ambulatory Visit: Payer: Self-pay | Admitting: Nurse Practitioner

## 2018-04-04 DIAGNOSIS — O219 Vomiting of pregnancy, unspecified: Secondary | ICD-10-CM

## 2018-04-04 DIAGNOSIS — Z712 Person consulting for explanation of examination or test findings: Secondary | ICD-10-CM

## 2018-04-04 DIAGNOSIS — Z3490 Encounter for supervision of normal pregnancy, unspecified, unspecified trimester: Secondary | ICD-10-CM

## 2018-04-04 MED ORDER — PROMETHAZINE HCL 25 MG PO TABS: 25.0000 mg | ORAL_TABLET | Freq: Four times a day (QID) | ORAL | 0 refills | Status: DC | PRN

## 2018-04-04 NOTE — Progress Notes (Signed)
Patient presents to office today for viability ultrasound results. Reviewed results with Dr Adrian Blackwater who finds living IUP, patient should begin prenatal care.  Informed patient of results, provided pictures, and reviewed dating. Patient verbalized understanding & reports continued nausea/vomiting & would like a prescription. Rx sent per protocol. Advised patient to begin prenatal care.  Chase Caller RN BSN 04/04/18

## 2018-04-04 NOTE — Progress Notes (Signed)
Chart reviewed - agree with RN documentation.   

## 2018-04-26 ENCOUNTER — Other Ambulatory Visit (HOSPITAL_COMMUNITY)
Admission: RE | Admit: 2018-04-26 | Discharge: 2018-04-26 | Disposition: A | Discharge status: Home / Self Care | Payer: Medicaid Other | Source: Ambulatory Visit | Attending: Medical | Admitting: Medical

## 2018-04-26 ENCOUNTER — Encounter: Payer: Self-pay | Admitting: Advanced Practice Midwife

## 2018-04-26 ENCOUNTER — Ambulatory Visit (INDEPENDENT_AMBULATORY_CARE_PROVIDER_SITE_OTHER): Payer: Medicaid Other | Admitting: Advanced Practice Midwife

## 2018-04-26 VITALS — BP 109/74 | HR 92 | Wt 133.0 lb

## 2018-04-26 DIAGNOSIS — Z3A13 13 weeks gestation of pregnancy: Secondary | ICD-10-CM

## 2018-04-26 DIAGNOSIS — O09291 Supervision of pregnancy with other poor reproductive or obstetric history, first trimester: Secondary | ICD-10-CM

## 2018-04-26 DIAGNOSIS — Z3480 Encounter for supervision of other normal pregnancy, unspecified trimester: Secondary | ICD-10-CM | POA: Insufficient documentation

## 2018-04-26 DIAGNOSIS — Z3481 Encounter for supervision of other normal pregnancy, first trimester: Secondary | ICD-10-CM

## 2018-04-26 DIAGNOSIS — O09292 Supervision of pregnancy with other poor reproductive or obstetric history, second trimester: Secondary | ICD-10-CM

## 2018-04-26 DIAGNOSIS — Z3A19 19 weeks gestation of pregnancy: Secondary | ICD-10-CM

## 2018-04-26 NOTE — Addendum Note (Signed)
Addended by: Sharen Counter A on: 04/26/2018 04:57 PM   Modules accepted: Orders

## 2018-04-26 NOTE — Patient Instructions (Signed)

## 2018-04-26 NOTE — Progress Notes (Signed)
Subjective:   [name] is a 28 y.o. G2P1001 at [redacted]w[redacted]d by LMP, c/w early [place], being seen today for her first obstetrical visit.  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 Patient does intend to breast feed. Pregnancy history fully reviewed.  Patient reports no complaints.  HISTORY: OB History  Gravida Para Term Preterm AB Living  2 1 1  0 0 1  SAB TAB Ectopic Multiple Live Births  0 0 0 0 1    # Outcome Date GA Lbr Len/2nd Weight Sex Delivery Anes PTL Lv  2 Current           1 Term 05/29/15 [redacted]w[redacted]d 28:08 / 02:56 3195 g M Vag-Spont EPI  LIV     Name: Sadowski,BOY Otto     Apgar1: 8  Apgar5: 9   Past Medical History:  Diagnosis Date  . Allergy   . Asthma   . Vaginal Pap smear, abnormal    +HPV, LSIL   Past Surgical History:  Procedure Laterality Date  . WISDOM TOOTH EXTRACTION Bilateral 2010   Family History  Problem Relation Age of Onset  . COPD Mother   . Hypertension Father   . Seizures Brother   . Diabetes Paternal Grandmother   . Hypertension Paternal Grandmother   . Diabetes Paternal Grandfather   . Hypertension Paternal Grandfather   . Diabetes Paternal Aunt   . Diabetes Paternal Uncle   . Cancer Neg Hx    Social History   Tobacco Use  . Smoking status: Never Smoker  . Smokeless tobacco: Never Used  Substance Use Topics  . Alcohol use: No  . Drug use: No   Allergies  Allergen Reactions  . Banana Itching and Swelling  . Orange Fruit [Citrus] Itching and Swelling   Current Outpatient Medications on File Prior to Visit  Medication Sig Dispense Refill  . albuterol (PROVENTIL HFA;VENTOLIN HFA) 108 (90 Base) MCG/ACT inhaler Inhale 1-2 puffs into the lungs every 6 (six) hours as needed for wheezing or  shortness of breath. 1 Inhaler 0  . promethazine (PHENERGAN) 25 MG tablet Take 1 tablet (25 mg total) by mouth every 6 (six) hours as needed for nausea or vomiting. (Patient not taking: Reported on 04/26/2018) 30 tablet 0  . Spacer/Aero-Holding Chambers (VALVED HOLDING CHAMBER) DEVI 1 Device by Does not apply route as needed. 1 Device 0   No current facility-administered medications on file prior to visit.      Exam   Vitals:   04/26/18 0915  BP: 109/74  Pulse: 92  Weight: 60.3 kg      Uterus:     Pelvic Exam: Perineum: no hemorrhoids, normal perineum   Vulva: normal external genitalia, no lesions   Vagina:  normal mucosa, normal discharge   Cervix: no lesions and normal, pap smear done.    Adnexa: normal adnexa and no mass, fullness, tenderness   Bony Pelvis: average  System: General: well-developed, well-nourished female in no acute distress   Breast:  normal appearance, no masses or tenderness   Skin: normal coloration and turgor, no rashes   Neurologic: oriented, normal, negative, normal mood   Extremities: normal strength, tone, and muscle mass, ROM of all joints is normal   HEENT PERRLA, extraocular movement intact and sclera clear, anicteric   Mouth/Teeth  mucous membranes moist, pharynx normal without lesions and dental hygiene good   Neck supple and no masses   Cardiovascular: regular rate and rhythm   Respiratory:  no respiratory distress, normal breath sounds   Abdomen: soft, non-tender; bowel sounds normal; no masses,  no organomegaly     Assessment:   Pregnancy: G2P1001 Patient Active Problem List   Diagnosis Date Noted  . Supervision of other normal pregnancy, antepartum 04/26/2018  . Abnormal Pap smear of cervix 05/26/2016  . Viral URI 05/26/2016  . Healthcare maintenance 05/26/2016  . 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 Supervision of other normal pregnancy, antepartum --Anticipatory guidance about next visits/weeks of  pregnancy given.  - Obstetric Panel, Including HIV - Culture, OB Urine - Genetic Screening - Hemoglobinopathy evaluation - Cystic Fibrosis Mutation 97 - SMN1 COPY NUMBER ANALYSIS (SMA Carrier Screen) - Enroll Patient in Babyscripts - Urine cytology ancillary only([place])  3. History of maternal third degree perineal laceration, currently pregnant in second trimester --Repair in 2017 by Dr Macon Large. No complications.  Desires vaginal delivery.  Initial labs drawn. Continue prenatal vitamins. Discussed and offered genetic screening options, including Quad screen/AFP, NIPS testing, and option to decline testing. Benefits/risks/alternatives reviewed. Pt aware that anatomy US is form of genetic screening with lower accuracy in detecting trisomies than blood work.  Pt chooses/declines genetic screening today. NIPS: ordered. Ultrasound discussed; fetal anatomic survey: ordered. Problem list reviewed and updated. The nature of [facility] with multiple MDs and other Advanced Practice Providers was explained to patient; also emphasized that residents, students are part of our team. Routine obstetric precautions reviewed. No follow-ups on file.   Sharen Counter, CNM 04/26/18 9:54 AM

## 2018-04-28 LAB — URINE CYTOLOGY ANCILLARY ONLY
Chlamydia: NEGATIVE
Neisseria Gonorrhea: NEGATIVE

## 2018-04-28 LAB — CULTURE, OB URINE

## 2018-04-28 LAB — URINE CULTURE, OB REFLEX

## 2018-05-03 ENCOUNTER — Telehealth: Payer: Self-pay

## 2018-05-03 NOTE — Telephone Encounter (Signed)
Received telephone voicemail from [employer] - verifying if our office had received CF test results on patient.   Called [employer] back [phone] spoke to Teresa/CSR - advised our office had received any lab results as of today. Rosey Bath advised that they are waiting on the SMA results but will fax the lab results of what they have then once they receive the SMA results they will fax it.

## 2018-05-04 NOTE — Telephone Encounter (Signed)
Forwarding to provider for review of abnormal lab results.

## 2018-05-05 LAB — SMN1 COPY NUMBER ANALYSIS (SMA CARRIER SCREENING)

## 2018-05-05 LAB — OBSTETRIC PANEL, INCLUDING HIV
ANTIBODY SCREEN: NEGATIVE
BASOS: 1 %
Basophils Absolute: 0 10*3/uL (ref 0.0–0.2)
EOS (ABSOLUTE): 0 10*3/uL (ref 0.0–0.4)
EOS: 0 %
HEMOGLOBIN: 12.3 g/dL (ref 11.1–15.9)
HEP B S AG: NEGATIVE
HIV SCREEN 4TH GENERATION: NONREACTIVE
Hematocrit: 35.8 % (ref 34.0–46.6)
Immature Grans (Abs): 0 10*3/uL (ref 0.0–0.1)
Immature Granulocytes: 0 %
LYMPHS ABS: 1.6 10*3/uL (ref 0.7–3.1)
Lymphs: 21 %
MCH: 32.3 pg (ref 26.6–33.0)
MCHC: 34.4 g/dL (ref 31.5–35.7)
MCV: 94 fL (ref 79–97)
Monocytes Absolute: 0.5 10*3/uL (ref 0.1–0.9)
Monocytes: 7 %
NEUTROS ABS: 5.2 10*3/uL (ref 1.4–7.0)
Neutrophils: 71 %
Platelets: 294 10*3/uL (ref 150–450)
RBC: 3.81 x10E6/uL (ref 3.77–5.28)
RDW: 13 % (ref 11.7–15.4)
RH TYPE: POSITIVE
RPR: NONREACTIVE
Rubella Antibodies, IGG: <0.9 index — ABNORMAL LOW (ref >0.99)
WBC: 7.3 10*3/uL (ref 3.4–10.8)

## 2018-05-05 LAB — HEMOGLOBINOPATHY EVALUATION
HGB C: 0 %
HGB S: 0 %
HGB VARIANT: 0 %
Hemoglobin A2 Quantitation: 2.4 % (ref 1.8–3.2)
Hemoglobin F Quantitation: 0 % (ref 0.0–2.0)
Hgb A: 97.6 % (ref 96.4–98.8)

## 2018-05-05 LAB — CYSTIC FIBROSIS MUTATION 97

## 2018-05-06 ENCOUNTER — Telehealth: Payer: Self-pay

## 2018-05-06 NOTE — Telephone Encounter (Signed)
Return patient call - DPR checked - LMOVM in detailed advising per Genetic Screening - Fetal sex: LR Female.

## 2018-05-23 NOTE — Patient Instructions (Signed)

## 2018-05-24 ENCOUNTER — Ambulatory Visit (INDEPENDENT_AMBULATORY_CARE_PROVIDER_SITE_OTHER): Payer: Medicaid Other | Admitting: Advanced Practice Midwife

## 2018-05-24 ENCOUNTER — Other Ambulatory Visit: Payer: Self-pay

## 2018-05-24 VITALS — BP 123/73 | HR 89 | Wt 139.0 lb

## 2018-05-24 DIAGNOSIS — Z3480 Encounter for supervision of other normal pregnancy, unspecified trimester: Secondary | ICD-10-CM

## 2018-05-24 DIAGNOSIS — O26892 Other specified pregnancy related conditions, second trimester: Secondary | ICD-10-CM

## 2018-05-24 DIAGNOSIS — Z3482 Encounter for supervision of other normal pregnancy, second trimester: Secondary | ICD-10-CM

## 2018-05-24 DIAGNOSIS — R102 Pelvic and perineal pain: Secondary | ICD-10-CM

## 2018-05-24 DIAGNOSIS — O26899 Other specified pregnancy related conditions, unspecified trimester: Secondary | ICD-10-CM

## 2018-05-24 DIAGNOSIS — Z3A19 19 weeks gestation of pregnancy: Secondary | ICD-10-CM

## 2018-05-24 NOTE — Progress Notes (Addendum)
   PRENATAL VISIT NOTE  Subjective:  [name] is a 28 y.o. G2P1001 at [redacted]w[redacted]d being seen today for ongoing prenatal care.  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  Patient reports rare sharp lower abdominal pain.  Contractions: Not present.  .  Movement: Present. Denies leaking of fluid.   The following portions of the patient's history were reviewed and updated as appropriate: allergies, current medications, past family history, past medical history, past social history, past surgical history and problem list.   Objective:   Vitals:   05/24/18 0902  BP: 123/73  Pulse: 89  Weight: 63 kg    Fetal Status: Fetal Heart Rate (bpm): 145   Movement: Present     General:  Alert, oriented and cooperative. Patient is in no acute distress.  Skin: Skin is warm and dry. No rash noted.   Cardiovascular: Normal heart rate noted  Respiratory: Normal respiratory effort, no problems with respiration noted  Abdomen: Soft, gravid, appropriate for gestational age.  Pain/Pressure: Absent     Pelvic: Cervical exam deferred        Extremities: Normal range of motion.     Mental Status: Normal mood and affect. Normal behavior. Normal judgment and thought content.   Assessment and Plan:  Pregnancy: G2P1001 at [redacted]w[redacted]d  1. Supervision of other normal pregnancy, antepartum --Anticipatory guidance about next visits/weeks of pregnancy given. - AFP, Serum, Open Spina Bifida - [place] MFM OB COMP + 14 WK; Future --Reviewed safety, visitor policy, reassurance about COVID-19 for pregnancy at this time.  2. Pain of round ligament affecting pregnancy, antepartum --Sharp pain in right lower abdomen when coughing or moving suddenly only.  No pain currently. Rest/ice/heat/warm bath/Tylenol for  pain.  Preterm labor symptoms and general obstetric precautions including but not limited to vaginal bleeding, contractions, leaking of fluid and fetal movement were reviewed in detail with the patient. Please refer to After Visit Summary for other counseling recommendations.   Return in about 4 weeks (around 06/21/2018).  Future Appointments  Date Time Provider Department Center  05/24/2018  9:45 AM Hurshel Party, CNM [facility] None  06/07/2018 10:15 AM [facility] [place] 4 [facility]  06/21/2018  8:15 AM [name], Wilmer Floor, CNM [facility] None    Sharen Counter, CNM

## 2018-05-24 NOTE — Addendum Note (Signed)
Addended by: Sharen Counter A on: 05/24/2018 09:48 AM   Modules accepted: Orders

## 2018-05-31 ENCOUNTER — Encounter (HOSPITAL_COMMUNITY): Payer: Self-pay

## 2018-06-07 ENCOUNTER — Ambulatory Visit (HOSPITAL_COMMUNITY)
Admission: RE | Admit: 2018-06-07 | Discharge: 2018-06-07 | Disposition: A | Discharge status: Home / Self Care | Payer: Medicaid Other | Source: Ambulatory Visit | Attending: Advanced Practice Midwife | Admitting: Advanced Practice Midwife

## 2018-06-07 ENCOUNTER — Other Ambulatory Visit (HOSPITAL_COMMUNITY): Payer: Self-pay | Admitting: *Deleted

## 2018-06-07 ENCOUNTER — Other Ambulatory Visit: Payer: Self-pay | Admitting: Advanced Practice Midwife

## 2018-06-07 ENCOUNTER — Other Ambulatory Visit: Payer: Self-pay

## 2018-06-07 DIAGNOSIS — Z363 Encounter for antenatal screening for malformations: Secondary | ICD-10-CM | POA: Diagnosis not present

## 2018-06-07 DIAGNOSIS — J45909 Unspecified asthma, uncomplicated: Secondary | ICD-10-CM | POA: Diagnosis not present

## 2018-06-07 DIAGNOSIS — Z3A19 19 weeks gestation of pregnancy: Secondary | ICD-10-CM

## 2018-06-07 DIAGNOSIS — Z3480 Encounter for supervision of other normal pregnancy, unspecified trimester: Secondary | ICD-10-CM | POA: Diagnosis not present

## 2018-06-07 DIAGNOSIS — O09892 Supervision of other high risk pregnancies, second trimester: Secondary | ICD-10-CM | POA: Diagnosis not present

## 2018-06-07 DIAGNOSIS — O9989 Other specified diseases and conditions complicating pregnancy, childbirth and the puerperium: Secondary | ICD-10-CM

## 2018-06-07 DIAGNOSIS — Z362 Encounter for other antenatal screening follow-up: Secondary | ICD-10-CM

## 2018-06-21 ENCOUNTER — Ambulatory Visit (INDEPENDENT_AMBULATORY_CARE_PROVIDER_SITE_OTHER): Payer: Medicaid Other | Admitting: Advanced Practice Midwife

## 2018-06-21 ENCOUNTER — Other Ambulatory Visit: Payer: Self-pay

## 2018-06-21 DIAGNOSIS — Z3480 Encounter for supervision of other normal pregnancy, unspecified trimester: Secondary | ICD-10-CM

## 2018-06-21 DIAGNOSIS — Z3482 Encounter for supervision of other normal pregnancy, second trimester: Secondary | ICD-10-CM

## 2018-06-21 NOTE — Progress Notes (Signed)
Patient reports fetal movement, denies pain. 

## 2018-06-21 NOTE — Patient Instructions (Signed)

## 2018-06-21 NOTE — Progress Notes (Signed)
   PRENATAL VISIT NOTE  Subjective:  [name] is a 28 y.o. G2P1001 at [redacted]w[redacted]d being seen today for ongoing prenatal care.  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  Patient reports no complaints.  Contractions: Not present. Vag. Bleeding: None.  Movement: Present. Denies leaking of fluid.   The following portions of the patient's history were reviewed and updated as appropriate: allergies, current medications, past family history, past medical history, past social history, past surgical history and problem list.   Objective:   Vitals:   06/21/18 0859  BP: 108/75  Pulse: 73  Weight: 67.9 kg    Fetal Status:     Movement: Present     General:  Alert, oriented and cooperative. Patient is in no acute distress.  Skin: Skin is warm and dry. No rash noted.   Cardiovascular: Normal heart rate noted  Respiratory: Normal respiratory effort, no problems with respiration noted  Abdomen: Soft, gravid, appropriate for gestational age.  Pain/Pressure: Absent     Pelvic: Cervical exam deferred        Extremities: Normal range of motion.     Mental Status: Normal mood and affect. Normal behavior. Normal judgment and thought content.   Assessment and Plan:  Pregnancy: G2P1001 at [redacted]w[redacted]d  1. Supervision of other normal pregnancy, antepartum --Anticipatory guidance about next visits/weeks of pregnancy given. --Reviewed safety, visitor policy, reassurance about COVID-19 for pregnancy at this time. Discussed possible changes to visits, including televisits, that may occur due to COVID-19.  The office remains open if pt needs to be seen and MAU is open 24 hours/day for OB emergencies. --Reviewed normal anatomy US, incomplete so f/u is scheduled --AFP today --Enroll in  babyscripts and optimization to enter BPs   Preterm labor symptoms and general obstetric precautions including but not limited to vaginal bleeding, contractions, leaking of fluid and fetal movement were reviewed in detail with the patient. Please refer to After Visit Summary for other counseling recommendations.   No follow-ups on file.  Future Appointments  Date Time Provider Department Center  07/21/2018  8:15 AM [facility] [place] 4 [facility]    Sharen Counter, CNM

## 2018-06-21 NOTE — Addendum Note (Signed)
Addended by: Kennon Portela on: 06/21/2018 01:20 PM   Modules accepted: Orders

## 2018-06-23 LAB — AFP, SERUM, OPEN SPINA BIFIDA
AFP MoM: 0.84
AFP Value: 60.3 ng/mL
Gest. Age on Collection Date: 21 weeks
Maternal Age At EDD: 26.4 yr
OSBR Risk 1 IN: 10000
Test Results:: NEGATIVE
Weight: 149 [lb_av]

## 2018-07-19 ENCOUNTER — Other Ambulatory Visit: Payer: Self-pay

## 2018-07-19 ENCOUNTER — Ambulatory Visit (INDEPENDENT_AMBULATORY_CARE_PROVIDER_SITE_OTHER): Payer: Medicaid Other | Admitting: Obstetrics

## 2018-07-19 ENCOUNTER — Encounter: Payer: Medicaid Other | Admitting: Advanced Practice Midwife

## 2018-07-19 ENCOUNTER — Encounter: Payer: Self-pay | Admitting: Obstetrics

## 2018-07-19 DIAGNOSIS — Z3480 Encounter for supervision of other normal pregnancy, unspecified trimester: Secondary | ICD-10-CM

## 2018-07-19 DIAGNOSIS — R12 Heartburn: Secondary | ICD-10-CM

## 2018-07-19 DIAGNOSIS — O26899 Other specified pregnancy related conditions, unspecified trimester: Secondary | ICD-10-CM

## 2018-07-19 DIAGNOSIS — O26892 Other specified pregnancy related conditions, second trimester: Secondary | ICD-10-CM

## 2018-07-19 DIAGNOSIS — Z3A25 25 weeks gestation of pregnancy: Secondary | ICD-10-CM

## 2018-07-19 MED ORDER — OMEPRAZOLE 20 MG PO CPDR: 20.0000 mg | DELAYED_RELEASE_CAPSULE | Freq: Two times a day (BID) | ORAL | 5 refills | Status: DC

## 2018-07-19 NOTE — Progress Notes (Signed)
Pt presents for webex visit. Pt identtified with two pt identifiers. She is 25 weeks today. Pt has not received her bp cuff yet, and she will set up her baby rx app. She has no concerns today.

## 2018-07-19 NOTE — Progress Notes (Signed)
   TELEHEALTH VIRTUAL OBSTETRICS PRENATAL VISIT ENCOUNTER NOTE  I connected with [name] on 07/19/18 at  9:00 AM EDT by WebEx at home and verified that I am speaking with the correct person using two identifiers.   I discussed the limitations, risks, security and privacy concerns of performing an evaluation and management service by telephone and the availability of in person appointments. I also discussed with the patient that there may be a patient responsible charge related to this service. The patient expressed understanding and agreed to proceed. Subjective:  [name] is a 28 y.o. G2P1001 at [redacted]w[redacted]d being seen today for ongoing prenatal care.  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  Patient reports heartburn.  Reports fetal movement. Contractions: Not present. Vag. Bleeding: None.  Movement: Present. Denies any contractions, bleeding or leaking of fluid.   The following portions of the patient's history were reviewed and updated as appropriate: allergies, current medications, past family history, past medical history, past social history, past surgical history and problem list.   Objective:  There were no vitals filed for this visit.  Fetal Status:     Movement: Present     General:  Alert, oriented and cooperative. Patient is in no acute distress.  Respiratory: Normal respiratory effort, no problems with respiration noted  Mental Status: Normal mood and affect. Normal behavior. Normal judgment and thought content.  Rest of physical exam deferred due to type of encounter  Assessment and Plan:  Pregnancy: G2P1001 at [redacted]w[redacted]d  1. Supervision of other normal pregnancy, antepartum Rx: - Babyscripts Schedule Optimization  2. Heartburn during  pregnancy, antepartum Rx: - omeprazole (PRILOSEC) 20 MG capsule; Take 1 capsule (20 mg total) by mouth 2 (two) times daily before a meal.  Dispense: 60 capsule; Refill: 5   Preterm labor symptoms and general obstetric precautions including but not limited to vaginal bleeding, contractions, leaking of fluid and fetal movement were reviewed in detail with the patient. I discussed the assessment and treatment plan with the patient. The patient was provided an opportunity to ask questions and all were answered. The patient agreed with the plan and demonstrated an understanding of the instructions. The patient was advised to call back or seek an in-person office evaluation/go to MAU at [facility] for any urgent or concerning symptoms. Please refer to After Visit Summary for other counseling recommendations.   I provided 10 minutes of face-to-face via WebEx time during this encounter.  Return in about 3 weeks (around 08/09/2018) for ROB, 2 hour OGTT.  Future Appointments  Date Time Provider Department Center  07/19/2018  9:00 AM Brock Bad, MD [facility] None  07/21/2018  8:15 AM [facility] [place] 4 [facility]    Coral Ceo, MD Center for [facility], [facility] 07-18-2018

## 2018-07-21 ENCOUNTER — Other Ambulatory Visit: Payer: Self-pay

## 2018-07-21 ENCOUNTER — Ambulatory Visit (HOSPITAL_COMMUNITY)
Admission: RE | Admit: 2018-07-21 | Discharge: 2018-07-21 | Disposition: A | Discharge status: Home / Self Care | Payer: Medicaid Other | Source: Ambulatory Visit | Attending: Obstetrics and Gynecology | Admitting: Obstetrics and Gynecology

## 2018-07-21 DIAGNOSIS — Z362 Encounter for other antenatal screening follow-up: Secondary | ICD-10-CM | POA: Diagnosis not present

## 2018-07-21 DIAGNOSIS — J45909 Unspecified asthma, uncomplicated: Secondary | ICD-10-CM | POA: Diagnosis not present

## 2018-07-21 DIAGNOSIS — O9989 Other specified diseases and conditions complicating pregnancy, childbirth and the puerperium: Secondary | ICD-10-CM | POA: Diagnosis not present

## 2018-07-21 DIAGNOSIS — O09892 Supervision of other high risk pregnancies, second trimester: Secondary | ICD-10-CM

## 2018-07-21 DIAGNOSIS — Z3A25 25 weeks gestation of pregnancy: Secondary | ICD-10-CM

## 2018-07-25 ENCOUNTER — Other Ambulatory Visit: Payer: Self-pay | Admitting: Obstetrics

## 2018-07-25 MED ORDER — BLOOD PRESSURE MONITOR KIT: 1.0000 | PACK | Freq: Every day | 0 refills | Status: AC

## 2018-08-10 ENCOUNTER — Other Ambulatory Visit: Payer: Medicaid Other

## 2018-08-10 ENCOUNTER — Other Ambulatory Visit: Payer: Self-pay

## 2018-08-10 ENCOUNTER — Ambulatory Visit (INDEPENDENT_AMBULATORY_CARE_PROVIDER_SITE_OTHER): Payer: Medicaid Other | Admitting: Obstetrics & Gynecology

## 2018-08-10 DIAGNOSIS — Z23 Encounter for immunization: Secondary | ICD-10-CM

## 2018-08-10 DIAGNOSIS — Z3483 Encounter for supervision of other normal pregnancy, third trimester: Secondary | ICD-10-CM

## 2018-08-10 DIAGNOSIS — Z3480 Encounter for supervision of other normal pregnancy, unspecified trimester: Secondary | ICD-10-CM

## 2018-08-10 DIAGNOSIS — Z3A28 28 weeks gestation of pregnancy: Secondary | ICD-10-CM

## 2018-08-10 MED ORDER — BLOOD PRESSURE KIT: 1.0000 | PACK | Freq: Once | 0 refills | Status: AC

## 2018-08-10 NOTE — Progress Notes (Signed)
   PRENATAL VISIT NOTE  Subjective:  [name] is a 28 y.o. G2P1001 at 42w1dbeing seen today for ongoing prenatal care.  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  Patient reports no complaints.  Contractions: Not present. Vag. Bleeding: None.  Movement: Present. Denies leaking of fluid.   The following portions of the patient's history were reviewed and updated as appropriate: allergies, current medications, past family history, past medical history, past social history, past surgical history and problem list.   Objective:   Vitals:   08/10/18 0825  BP: 121/86  Pulse: (!) 101  Weight: 159 lb (72.1 kg)    Fetal Status: Fetal Heart Rate (bpm): 140   Movement: Present     General:  Alert, oriented and cooperative. Patient is in no acute distress.  Skin: Skin is warm and dry. No rash noted.   Cardiovascular: Normal heart rate noted  Respiratory: Normal respiratory effort, no problems with respiration noted  Abdomen: Soft, gravid, appropriate for gestational age.  Pain/Pressure: Absent     Pelvic: Cervical exam deferred        Extremities: Normal range of motion.     Mental Status: Normal mood and affect. Normal behavior. Normal judgment and thought content.   Assessment and Plan:  Pregnancy: G2P1001 at [redacted]w[redacted]d. Supervision of other normal pregnancy, antepartum Routine labs - Glucose Tolerance, 2 Hours w/1 Hour - CBC - HIV Antibody (routine testing w rflx) - RPR - Tdap vaccine greater than or equal to 7yo IM - Blood Pressure KIT; 1 kit by Does not apply route once for 1 dose.  Dispense: 1 each; Refill: 0  Preterm labor symptoms and general obstetric precautions including but not limited to vaginal bleeding, contractions, leaking of fluid and fetal  movement were reviewed in detail with the patient. Please refer to After Visit Summary for other counseling recommendations.   Return in about 4 weeks (around 09/07/2018).  No future appointments.  JaEmeterio ReeveMD

## 2018-08-10 NOTE — Patient Instructions (Signed)

## 2018-08-11 LAB — CBC
Hematocrit: 36.2 % (ref 34.0–46.6)
Hemoglobin: 12.1 g/dL (ref 11.1–15.9)
MCH: 32.4 pg (ref 26.6–33.0)
MCHC: 33.4 g/dL (ref 31.5–35.7)
MCV: 97 fL (ref 79–97)
Platelets: 268 10*3/uL (ref 150–450)
RBC: 3.74 x10E6/uL — ABNORMAL LOW (ref 3.77–5.28)
RDW: 11.9 % (ref 11.7–15.4)
WBC: 10.6 10*3/uL (ref 3.4–10.8)

## 2018-08-11 LAB — RPR: RPR Ser Ql: NONREACTIVE

## 2018-08-11 LAB — GLUCOSE TOLERANCE, 2 HOURS W/ 1HR
Glucose, 1 hour: 138 mg/dL (ref 65–179)
Glucose, 2 hour: 88 mg/dL (ref 65–152)
Glucose, Fasting: 71 mg/dL (ref 65–91)

## 2018-08-11 LAB — HIV ANTIBODY (ROUTINE TESTING W REFLEX): HIV Screen 4th Generation wRfx: NONREACTIVE

## 2018-09-07 ENCOUNTER — Ambulatory Visit (INDEPENDENT_AMBULATORY_CARE_PROVIDER_SITE_OTHER): Payer: Medicaid Other | Admitting: Obstetrics & Gynecology

## 2018-09-07 DIAGNOSIS — Z3A32 32 weeks gestation of pregnancy: Secondary | ICD-10-CM | POA: Diagnosis not present

## 2018-09-07 DIAGNOSIS — Z3480 Encounter for supervision of other normal pregnancy, unspecified trimester: Secondary | ICD-10-CM

## 2018-09-07 DIAGNOSIS — Z3483 Encounter for supervision of other normal pregnancy, third trimester: Secondary | ICD-10-CM | POA: Diagnosis not present

## 2018-09-07 NOTE — Progress Notes (Signed)
WebEx OB. Reports no problems today.

## 2018-09-07 NOTE — Patient Instructions (Signed)

## 2018-09-07 NOTE — Progress Notes (Signed)
   TELEHEALTH VIRTUAL OBSTETRICS VISIT ENCOUNTER NOTE  I connected with [name] on 09/07/18 at  9:00 AM EDT by telephone at home and verified that I am speaking with the correct person using two identifiers.   I discussed the limitations, risks, security and privacy concerns of performing an evaluation and management service by telephone and the availability of in person appointments. I also discussed with the patient that there may be a patient responsible charge related to this service. The patient expressed understanding and agreed to proceed.  Subjective:  [name] is a 28 y.o. G2P1001 at [redacted]w[redacted]d being followed for ongoing prenatal care.  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  Patient reports no complaints. Reports fetal movement. Denies any contractions, bleeding or leaking of fluid.   The following portions of the patient's history were reviewed and updated as appropriate: allergies, current medications, past family history, past medical history, past social history, past surgical history and problem list.   Objective:   General:  Alert, oriented and cooperative.   Mental Status: Normal mood and affect perceived. Normal judgment and thought content.  Rest of physical exam deferred due to type of encounter  Assessment and Plan:  Pregnancy: G2P1001 at [redacted]w[redacted]d 1. Supervision of other normal pregnancy, antepartum  Discussed vaginal delivery after 3rd degree laceration  Preterm labor symptoms and general obstetric precautions including but not limited to vaginal bleeding, contractions, leaking of fluid and fetal movement were reviewed in detail with the patient.  I discussed the assessment and treatment plan with the patient. The patient was provided  an opportunity to ask questions and all were answered. The patient agreed with the plan and demonstrated an understanding of the instructions. The patient was advised to call back or seek an in-person office evaluation/go to MAU at [facility] for any urgent or concerning symptoms. Please refer to After Visit Summary for other counseling recommendations.   I provided 10 minutes of non-face-to-face time during this encounter.  Return in about 4 weeks (around 10/05/2018) for in person GBS.  No future appointments.  Emeterio Reeve, MD [facility] [place] [place]

## 2018-10-05 ENCOUNTER — Encounter: Payer: Self-pay | Admitting: Obstetrics & Gynecology

## 2018-10-05 ENCOUNTER — Other Ambulatory Visit: Payer: Self-pay

## 2018-10-05 ENCOUNTER — Other Ambulatory Visit (HOSPITAL_COMMUNITY)
Admission: RE | Admit: 2018-10-05 | Discharge: 2018-10-05 | Disposition: A | Discharge status: Home / Self Care | Payer: Medicaid Other | Source: Ambulatory Visit | Attending: Obstetrics & Gynecology | Admitting: Obstetrics & Gynecology

## 2018-10-05 ENCOUNTER — Ambulatory Visit (INDEPENDENT_AMBULATORY_CARE_PROVIDER_SITE_OTHER): Payer: Medicaid Other | Admitting: Obstetrics & Gynecology

## 2018-10-05 DIAGNOSIS — Z3480 Encounter for supervision of other normal pregnancy, unspecified trimester: Secondary | ICD-10-CM | POA: Diagnosis not present

## 2018-10-05 DIAGNOSIS — O26899 Other specified pregnancy related conditions, unspecified trimester: Secondary | ICD-10-CM

## 2018-10-05 DIAGNOSIS — R12 Heartburn: Secondary | ICD-10-CM

## 2018-10-05 DIAGNOSIS — O26893 Other specified pregnancy related conditions, third trimester: Secondary | ICD-10-CM

## 2018-10-05 DIAGNOSIS — Z3A36 36 weeks gestation of pregnancy: Secondary | ICD-10-CM

## 2018-10-05 DIAGNOSIS — Z283 Underimmunization status: Secondary | ICD-10-CM | POA: Insufficient documentation

## 2018-10-05 DIAGNOSIS — Z8759 Personal history of other complications of pregnancy, childbirth and the puerperium: Secondary | ICD-10-CM | POA: Insufficient documentation

## 2018-10-05 DIAGNOSIS — Z2839 Other underimmunization status: Secondary | ICD-10-CM | POA: Insufficient documentation

## 2018-10-05 MED ORDER — OMEPRAZOLE 20 MG PO CPDR: 20.0000 mg | DELAYED_RELEASE_CAPSULE | Freq: Two times a day (BID) | ORAL | 5 refills | Status: DC

## 2018-10-05 NOTE — Patient Instructions (Signed)
Return to office for any scheduled appointments. Call the office or go to the MAU at [facility] [place] if:  You begin to have strong, frequent contractions  Your water breaks.  Sometimes it is a big gush of fluid, sometimes it is just a trickle that keeps getting your panties wet or running down your legs  You have vaginal bleeding.  It is normal to have a small amount of spotting if your cervix was checked.   You do not feel your baby moving like normal.  If you do not, get something to eat and drink and lay down and focus on feeling your baby move.   If your baby is still not moving like normal, you should call the office or go to MAU.  Any other obstetric concerns.   

## 2018-10-05 NOTE — Progress Notes (Signed)
   PRENATAL VISIT NOTE  Subjective:  [name] is a 28 y.o. G2P1001 at [redacted]w[redacted]d being seen today for ongoing prenatal care.  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  Patient reports heartburn.  Contractions: Not present. Vag. Bleeding: None.  Movement: Present. Denies leaking of fluid.   The following portions of the patient's history were reviewed and updated as appropriate: allergies, current medications, past family history, past medical history, past social history, past surgical history and problem list.   Objective:   Vitals:   10/05/18 0829  BP: 110/71  Pulse: 86  Temp: 98.8 F (37.1 C)  Weight: 168 lb 9.6 oz (76.5 kg)    Fetal Status: Fetal Heart Rate (bpm): 138 Fundal Height: 36 cm Movement: Present  Presentation: Vertex  General:  Alert, oriented and cooperative. Patient is in no acute distress.  Skin: Skin is warm and dry. No rash noted.   Cardiovascular: Normal heart rate noted  Respiratory: Normal respiratory effort, no problems with respiration noted  Abdomen: Soft, gravid, appropriate for gestational age.  Pain/Pressure: Absent     Pelvic: Cervical exam performed Dilation: 1 Effacement (%): 50 Station: Ballotable  Extremities: Normal range of motion.  Edema: None  Mental Status: Normal mood and affect. Normal behavior. Normal judgment and thought content.   Assessment and Plan:  Pregnancy: G2P1001 at [redacted]w[redacted]d 1. Heartburn during pregnancy, antepartum Prilosec re-prescribed for patient. - omeprazole (PRILOSEC) 20 MG capsule; Take 1 capsule (20 mg total) by mouth 2 (two) times daily before a meal.  Dispense: 60 capsule; Refill: 5  2. Supervision of other normal  pregnancy, antepartum Pelvic cultures done today. - Strep Gp B NAA - Cervicovaginal ancillary only( [place]) Preterm labor symptoms and general obstetric precautions including but not limited to vaginal bleeding, contractions, leaking of fluid and fetal movement were reviewed in detail with the patient. Please refer to After Visit Summary for other counseling recommendations.   Return in about 1 week (around 10/12/2018) for Virtual LOB Visit   2 weeks: OFFICE LOB Visit.  No future appointments.  Verita Schneiders, MD

## 2018-10-05 NOTE — Progress Notes (Signed)
Pt presents for ROB/GBS/GC/CT. Pt c/o heartburn. Taking Tums everyday with minimum relief.

## 2018-10-06 LAB — CERVICOVAGINAL ANCILLARY ONLY
Chlamydia: NEGATIVE
Neisseria Gonorrhea: NEGATIVE

## 2018-10-07 ENCOUNTER — Encounter: Payer: Self-pay | Admitting: Obstetrics & Gynecology

## 2018-10-07 DIAGNOSIS — O99820 Streptococcus B carrier state complicating pregnancy: Secondary | ICD-10-CM | POA: Insufficient documentation

## 2018-10-07 LAB — STREP GP B NAA: Strep Gp B NAA: POSITIVE — AB

## 2018-10-13 ENCOUNTER — Ambulatory Visit (INDEPENDENT_AMBULATORY_CARE_PROVIDER_SITE_OTHER): Payer: Medicaid Other | Admitting: Certified Nurse Midwife

## 2018-10-13 ENCOUNTER — Other Ambulatory Visit: Payer: Self-pay

## 2018-10-13 ENCOUNTER — Encounter: Payer: Self-pay | Admitting: Certified Nurse Midwife

## 2018-10-13 DIAGNOSIS — Z3480 Encounter for supervision of other normal pregnancy, unspecified trimester: Secondary | ICD-10-CM

## 2018-10-13 DIAGNOSIS — O99891 Other specified diseases and conditions complicating pregnancy: Secondary | ICD-10-CM

## 2018-10-13 DIAGNOSIS — Z283 Underimmunization status: Secondary | ICD-10-CM

## 2018-10-13 DIAGNOSIS — O9989 Other specified diseases and conditions complicating pregnancy, childbirth and the puerperium: Secondary | ICD-10-CM

## 2018-10-13 DIAGNOSIS — O09899 Supervision of other high risk pregnancies, unspecified trimester: Secondary | ICD-10-CM

## 2018-10-13 DIAGNOSIS — Z3A37 37 weeks gestation of pregnancy: Secondary | ICD-10-CM

## 2018-10-13 DIAGNOSIS — O99820 Streptococcus B carrier state complicating pregnancy: Secondary | ICD-10-CM

## 2018-10-13 NOTE — Patient Instructions (Signed)

## 2018-10-13 NOTE — Progress Notes (Signed)
TELEHEALTH OBSTETRICS PRENATAL VIRTUAL VIDEO VISIT ENCOUNTER NOTE  Provider location: [name]   I connected with [name] on 10/13/18 at  3:56 PM EDT by WebEx Video Encounter at home and verified that I am speaking with the correct person using two identifiers.   I discussed the limitations, risks, security and privacy concerns of performing an evaluation and management service virtually and the availability of in person appointments. I also discussed with the patient that there may be a patient responsible charge related to this service. The patient expressed understanding and agreed to proceed. Subjective:  [name] is a 28 y.o. G2P1001 at [redacted]w[redacted]d being seen today for ongoing prenatal care.  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  Patient reports occasional contractions.  Contractions: Irritability. Vag. Bleeding: None.  Movement: Present. Denies any leaking of fluid.   The following portions of the patient's history were reviewed and updated as appropriate: allergies, current medications, past family history, past medical history, past social history, past surgical history and problem list.   Objective:  There were no vitals filed for this visit.  Fetal Status:     Movement: Present     General:  Alert, oriented and cooperative. Patient is in no acute distress.  Respiratory: Normal respiratory effort, no problems with respiration noted  Mental Status: Normal mood and affect. Normal behavior. Normal judgment and thought content.  Rest of physical exam deferred due to type of  encounter  Imaging: No results found.  Assessment and Plan:  Pregnancy: G2P1001 at [redacted]w[redacted]d 1. Supervision of other normal pregnancy, antepartum - patient doing well, no complaints, ready to have baby  - Routine prenatal care - Anticipatory guidance on upcoming appointments  - Educated and discussed membrane sweep in office around 39 weeks  - Discussed use of EPO, RRT and IC to induce contractions  - Discussed labor precautions and reasons to go to hospital   2. Rubella non-immune status, antepartum - MMR PP   3. Group B Streptococcus carrier, +RV culture, currently pregnant - Treat in labor    Term labor symptoms and general obstetric precautions including but not limited to vaginal bleeding, contractions, leaking of fluid and fetal movement were reviewed in detail with the patient. I discussed the assessment and treatment plan with the patient. The patient was provided an opportunity to ask questions and all were answered. The patient agreed with the plan and demonstrated an understanding of the instructions. The patient was advised to call back or seek an in-person office evaluation/go to [facility] for any urgent or concerning symptoms. Please refer to After Visit Summary for other counseling recommendations.   I provided 7 minutes of face-to-face time during this encounter.  Return in about 12 days (around 10/25/2018) for ROB/membrane sweep.  Future Appointments  Date Time Provider Department Center  10/20/2018 10:00 AM Rogers, Veronica C, CNM [facility] None    Veronica C Rogers, CNM [facility]  

## 2018-10-13 NOTE — Progress Notes (Signed)
ROB with no complaints.

## 2018-10-20 ENCOUNTER — Other Ambulatory Visit: Payer: Self-pay

## 2018-10-20 ENCOUNTER — Ambulatory Visit (INDEPENDENT_AMBULATORY_CARE_PROVIDER_SITE_OTHER): Payer: Medicaid Other | Admitting: Certified Nurse Midwife

## 2018-10-20 VITALS — BP 115/77 | HR 72 | Wt 170.2 lb

## 2018-10-20 DIAGNOSIS — O99820 Streptococcus B carrier state complicating pregnancy: Secondary | ICD-10-CM

## 2018-10-20 DIAGNOSIS — Z3480 Encounter for supervision of other normal pregnancy, unspecified trimester: Secondary | ICD-10-CM

## 2018-10-20 DIAGNOSIS — Z283 Underimmunization status: Secondary | ICD-10-CM

## 2018-10-20 DIAGNOSIS — O99891 Other specified diseases and conditions complicating pregnancy: Secondary | ICD-10-CM

## 2018-10-20 DIAGNOSIS — O9989 Other specified diseases and conditions complicating pregnancy, childbirth and the puerperium: Secondary | ICD-10-CM

## 2018-10-20 DIAGNOSIS — O09899 Supervision of other high risk pregnancies, unspecified trimester: Secondary | ICD-10-CM

## 2018-10-20 DIAGNOSIS — Z3A38 38 weeks gestation of pregnancy: Secondary | ICD-10-CM

## 2018-10-20 NOTE — Progress Notes (Signed)
She reports not using babyscripts at this time.

## 2018-10-20 NOTE — Progress Notes (Signed)
   PRENATAL VISIT NOTE  Subjective:  [name] is a 28 y.o. G2P1001 at 8w2dbeing seen today for ongoing prenatal care.  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  Patient reports no complaints.  Contractions: Not present. Vag. Bleeding: None.  Movement: Present. Denies leaking of fluid.   The following portions of the patient's history were reviewed and updated as appropriate: allergies, current medications, past family history, past medical history, past social history, past surgical history and problem list.   Objective:   Vitals:   10/20/18 0957  BP: 115/77  Pulse: 72  Weight: 170 lb 3.2 oz (77.2 kg)    Fetal Status: Fetal Heart Rate (bpm): 141 Fundal Height: 36 cm Movement: Present  Presentation: Vertex  General:  Alert, oriented and cooperative. Patient is in no acute distress.  Skin: Skin is warm and dry. No rash noted.   Cardiovascular: Normal heart rate noted  Respiratory: Normal respiratory effort, no problems with respiration noted  Abdomen: Soft, gravid, appropriate for gestational age.  Pain/Pressure: Absent     Pelvic: Cervical exam performed Dilation: 1 Effacement (%): 50 Station: -3  Extremities: Normal range of motion.  Edema: None  Mental Status: Normal mood and affect. Normal behavior. Normal judgment and thought content.   Assessment and Plan:  Pregnancy: G2P1001 at 315w2d. Supervision of other normal pregnancy, antepartum - Patient doing well, no complaints, denies contractions - Has been using EPO but could not find RRT  - Encouraged patient to have IC to help stimulate contractions  -  Routine prenatal care - Anticipatory guidance on upcoming appointments with possible membrane sweep at 39 weeks if patient is 2cm  - COVID19 precautions  - Reasons to present to hospital discussed   2. Rubella non-immune status, antepartum - MMR PP   3. Group B Streptococcus carrier, +RV culture, currently pregnant - Treat in labor   Term labor symptoms and general obstetric precautions including but not limited to vaginal bleeding, contractions, leaking of fluid and fetal movement were reviewed in detail with the patient. Please refer to After Visit Summary for other counseling recommendations.    Future Appointments  Date Time Provider [place]8/18/2020 10:00 AM Constant, PeVickii ChafeMD [facility]    VeLajean ManesCNM

## 2018-10-25 ENCOUNTER — Ambulatory Visit (INDEPENDENT_AMBULATORY_CARE_PROVIDER_SITE_OTHER): Payer: Medicaid Other | Admitting: Obstetrics and Gynecology

## 2018-10-25 ENCOUNTER — Encounter: Payer: Self-pay | Admitting: Obstetrics and Gynecology

## 2018-10-25 ENCOUNTER — Other Ambulatory Visit: Payer: Self-pay

## 2018-10-25 VITALS — BP 108/71 | HR 90 | Wt 172.0 lb

## 2018-10-25 DIAGNOSIS — Z3480 Encounter for supervision of other normal pregnancy, unspecified trimester: Secondary | ICD-10-CM

## 2018-10-25 DIAGNOSIS — O99820 Streptococcus B carrier state complicating pregnancy: Secondary | ICD-10-CM

## 2018-10-25 DIAGNOSIS — Z3A39 39 weeks gestation of pregnancy: Secondary | ICD-10-CM

## 2018-10-25 DIAGNOSIS — O9989 Other specified diseases and conditions complicating pregnancy, childbirth and the puerperium: Secondary | ICD-10-CM

## 2018-10-25 DIAGNOSIS — Z283 Underimmunization status: Secondary | ICD-10-CM

## 2018-10-25 DIAGNOSIS — O09899 Supervision of other high risk pregnancies, unspecified trimester: Secondary | ICD-10-CM

## 2018-10-25 NOTE — Progress Notes (Signed)
   PRENATAL VISIT NOTE  Subjective:  [name] is a 28 y.o. G2P1001 at [redacted]w[redacted]d being seen today for ongoing prenatal care.  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  Patient reports no complaints.  Contractions: Not present. Vag. Bleeding: None.  Movement: Present. Denies leaking of fluid.   The following portions of the patient's history were reviewed and updated as appropriate: allergies, current medications, past family history, past medical history, past social history, past surgical history and problem list.   Objective:   Vitals:   10/25/18 0957  BP: 108/71  Pulse: 90  Weight: 172 lb (78 kg)    Fetal Status: Fetal Heart Rate (bpm): 148 Fundal Height: 36 cm Movement: Present  Presentation: Vertex  General:  Alert, oriented and cooperative. Patient is in no acute distress.  Skin: Skin is warm and dry. No rash noted.   Cardiovascular: Normal heart rate noted  Respiratory: Normal respiratory effort, no problems with respiration noted  Abdomen: Soft, gravid, appropriate for gestational age.  Pain/Pressure: Absent     Pelvic: Cervical exam performed Dilation: 2 Effacement (%): 50 Station: -3  Extremities: Normal range of motion.  Edema: None  Mental Status: Normal mood and affect. Normal behavior. Normal judgment and thought content.   Assessment and Plan:  Pregnancy: G2P1001 at [redacted]w[redacted]d 1. Supervision of other normal pregnancy, antepartum Patient is doing well without complaints Patient scheduled for IOL at 41 weeks  2. Group B Streptococcus carrier, +RV culture, currently pregnant Prophylaxis in labor  3. Rubella  non-immune status, antepartum Will offer postpartum  Term labor symptoms and general obstetric precautions including but not limited to vaginal bleeding, contractions, leaking of fluid and fetal movement were reviewed in detail with the patient. Please refer to After Visit Summary for other counseling recommendations.   Return in about 1 week (around 11/01/2018) for IN PERSON, ROB, Low risk.  Future Appointments  Date Time Provider [place]  11/08/2018  7:30 AM [facility] [place] None    Mora Bellman, MD

## 2018-10-25 NOTE — Progress Notes (Signed)
ROB.  GBS Positive.

## 2018-10-26 ENCOUNTER — Telehealth (HOSPITAL_COMMUNITY): Payer: Self-pay | Admitting: *Deleted

## 2018-10-26 NOTE — Telephone Encounter (Signed)
Preadmission screen  

## 2018-10-31 ENCOUNTER — Encounter (HOSPITAL_COMMUNITY): Payer: Self-pay | Admitting: *Deleted

## 2018-10-31 ENCOUNTER — Telehealth (HOSPITAL_COMMUNITY): Payer: Self-pay | Admitting: *Deleted

## 2018-10-31 NOTE — Telephone Encounter (Signed)
Preadmission screen  

## 2018-11-01 ENCOUNTER — Other Ambulatory Visit: Payer: Self-pay | Admitting: Advanced Practice Midwife

## 2018-11-03 ENCOUNTER — Ambulatory Visit (INDEPENDENT_AMBULATORY_CARE_PROVIDER_SITE_OTHER): Payer: Medicaid Other | Admitting: Obstetrics and Gynecology

## 2018-11-03 ENCOUNTER — Encounter: Payer: Self-pay | Admitting: Obstetrics and Gynecology

## 2018-11-03 ENCOUNTER — Other Ambulatory Visit: Payer: Self-pay

## 2018-11-03 VITALS — BP 115/74 | HR 91 | Temp 97.1°F | Wt 177.0 lb

## 2018-11-03 DIAGNOSIS — O9989 Other specified diseases and conditions complicating pregnancy, childbirth and the puerperium: Secondary | ICD-10-CM

## 2018-11-03 DIAGNOSIS — O09899 Supervision of other high risk pregnancies, unspecified trimester: Secondary | ICD-10-CM

## 2018-11-03 DIAGNOSIS — O99820 Streptococcus B carrier state complicating pregnancy: Secondary | ICD-10-CM

## 2018-11-03 DIAGNOSIS — Z283 Underimmunization status: Secondary | ICD-10-CM

## 2018-11-03 DIAGNOSIS — Z141 Cystic fibrosis carrier: Secondary | ICD-10-CM

## 2018-11-03 DIAGNOSIS — Z3480 Encounter for supervision of other normal pregnancy, unspecified trimester: Secondary | ICD-10-CM

## 2018-11-03 DIAGNOSIS — Z3A40 40 weeks gestation of pregnancy: Secondary | ICD-10-CM

## 2018-11-03 NOTE — Progress Notes (Signed)
CC: None  Pt request cervix xheck NST today.

## 2018-11-03 NOTE — Progress Notes (Signed)
Subjective:  [name] is a 28 y.o. G2P1001 at [redacted]w[redacted]d being seen today for ongoing prenatal care.  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  Patient reports no complaints.  Contractions: Not present. Vag. Bleeding: None.  Movement: Present. Denies leaking of fluid.   The following portions of the patient's history were reviewed and updated as appropriate: allergies, current medications, past family history, past medical history, past social history, past surgical history and problem list. Problem list updated.  Objective:   Vitals:   11/03/18 1321  BP: 115/74  Pulse: 91  Temp: (!) 97.1 F (36.2 C)  Weight: 177 lb (80.3 kg)    Fetal Status: Fetal Heart Rate (bpm): NST  Fundal Height: 37 cm Movement: Present  Presentation: Vertex  General:  Alert, oriented and cooperative. Patient is in no acute distress.  Skin: Skin is warm and dry. No rash noted.   Cardiovascular: Normal heart rate noted  Respiratory: Normal respiratory effort, no problems with respiration noted  Abdomen: Soft, gravid, appropriate for gestational age. Pain/Pressure: Absent     Pelvic: Vag. Bleeding: None     Cervical exam deferred        Extremities: Normal range of motion.  Edema: None  Mental Status: Normal mood and affect. Normal behavior. Normal judgment and thought content.    Assessment and Plan:  Pregnancy: G2P1001 at [redacted]w[redacted]d  1. Supervision of other normal pregnancy, antepartum Patient is doing well without any complaints today Scheduled for IOL at 41 weeks (11/08/2018) NST reactive, baseline 140 BPP prior to IOL  2. 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 Rubella non-immune status, antepartum Will offer post-partum  4. Group B Streptococcus carrier, +RV culture, currently pregnant PCN prophylaxis in labor   Term labor symptoms and general obstetric precautions including but not limited to vaginal bleeding, contractions, leaking of fluid and fetal movement were reviewed in detail with the patient. Please refer to After Visit Summary for other counseling recommendations.  Return in about 5 weeks (around 12/08/2018) for post partum check.   Karman Veney L, DO

## 2018-11-04 ENCOUNTER — Telehealth: Payer: Self-pay | Admitting: Family Medicine

## 2018-11-04 NOTE — Telephone Encounter (Signed)
Attempted to call patient about her appointment on 8/31 @ 1:15. No answer left voicemail instructing patient to wear a face mask for the entire appointment and no visitors are allowed during the visit. Patient instructed not to attend the appointment if she was any symptoms. Symptom list and office number left.

## 2018-11-06 ENCOUNTER — Other Ambulatory Visit (HOSPITAL_COMMUNITY)
Admission: RE | Admit: 2018-11-06 | Discharge: 2018-11-06 | Disposition: A | Discharge status: Home / Self Care | Payer: Medicaid Other | Source: Ambulatory Visit | Attending: Obstetrics & Gynecology | Admitting: Obstetrics & Gynecology

## 2018-11-06 ENCOUNTER — Other Ambulatory Visit: Payer: Self-pay

## 2018-11-06 DIAGNOSIS — Z20828 Contact with and (suspected) exposure to other viral communicable diseases: Secondary | ICD-10-CM | POA: Insufficient documentation

## 2018-11-06 DIAGNOSIS — Z01812 Encounter for preprocedural laboratory examination: Secondary | ICD-10-CM | POA: Insufficient documentation

## 2018-11-06 LAB — SARS CORONAVIRUS 2 (TAT 6-24 HRS): SARS Coronavirus 2: NEGATIVE

## 2018-11-06 NOTE — MAU Note (Signed)
Pt here for PAT covid swab, denies any symptoms. Swab collected 

## 2018-11-07 ENCOUNTER — Ambulatory Visit (INDEPENDENT_AMBULATORY_CARE_PROVIDER_SITE_OTHER): Payer: Medicaid Other | Admitting: *Deleted

## 2018-11-07 ENCOUNTER — Ambulatory Visit: Payer: Self-pay

## 2018-11-07 VITALS — BP 115/69 | HR 94 | Wt 175.0 lb

## 2018-11-07 DIAGNOSIS — O480 Post-term pregnancy: Secondary | ICD-10-CM | POA: Diagnosis not present

## 2018-11-08 ENCOUNTER — Inpatient Hospital Stay (HOSPITAL_COMMUNITY)
Admission: AD | Admit: 2018-11-08 | Discharge: 2018-11-10 | DRG: 807 | Disposition: A | Discharge status: Home / Self Care | Payer: Medicaid Other | Attending: Family Medicine | Admitting: Family Medicine

## 2018-11-08 ENCOUNTER — Inpatient Hospital Stay (HOSPITAL_COMMUNITY): Payer: Medicaid Other | Admitting: Anesthesiology

## 2018-11-08 ENCOUNTER — Encounter (HOSPITAL_COMMUNITY): Payer: Self-pay | Admitting: *Deleted

## 2018-11-08 ENCOUNTER — Inpatient Hospital Stay (HOSPITAL_COMMUNITY): Payer: Medicaid Other

## 2018-11-08 ENCOUNTER — Other Ambulatory Visit: Payer: Self-pay

## 2018-11-08 DIAGNOSIS — Z141 Cystic fibrosis carrier: Secondary | ICD-10-CM | POA: Diagnosis not present

## 2018-11-08 DIAGNOSIS — Z20828 Contact with and (suspected) exposure to other viral communicable diseases: Secondary | ICD-10-CM | POA: Diagnosis present

## 2018-11-08 DIAGNOSIS — O99820 Streptococcus B carrier state complicating pregnancy: Secondary | ICD-10-CM

## 2018-11-08 DIAGNOSIS — Z3A41 41 weeks gestation of pregnancy: Secondary | ICD-10-CM | POA: Diagnosis not present

## 2018-11-08 DIAGNOSIS — O480 Post-term pregnancy: Secondary | ICD-10-CM | POA: Diagnosis present

## 2018-11-08 DIAGNOSIS — Z23 Encounter for immunization: Secondary | ICD-10-CM

## 2018-11-08 DIAGNOSIS — Z3490 Encounter for supervision of normal pregnancy, unspecified, unspecified trimester: Secondary | ICD-10-CM

## 2018-11-08 DIAGNOSIS — O99824 Streptococcus B carrier state complicating childbirth: Secondary | ICD-10-CM | POA: Diagnosis present

## 2018-11-08 DIAGNOSIS — Z2839 Other underimmunization status: Secondary | ICD-10-CM

## 2018-11-08 DIAGNOSIS — Z283 Underimmunization status: Secondary | ICD-10-CM

## 2018-11-08 DIAGNOSIS — Z3480 Encounter for supervision of other normal pregnancy, unspecified trimester: Secondary | ICD-10-CM

## 2018-11-08 LAB — TYPE AND SCREEN
ABO/RH(D): B POS
Antibody Screen: NEGATIVE

## 2018-11-08 LAB — CBC
HCT: 40.4 % (ref 36.0–46.0)
Hemoglobin: 13.6 g/dL (ref 12.0–15.0)
MCH: 32.9 pg (ref 26.0–34.0)
MCHC: 33.7 g/dL (ref 30.0–36.0)
MCV: 97.8 fL (ref 80.0–100.0)
Platelets: 255 10*3/uL (ref 150–400)
RBC: 4.13 MIL/uL (ref 3.87–5.11)
RDW: 13.9 % (ref 11.5–15.5)
WBC: 6.5 10*3/uL (ref 4.0–10.5)
nRBC: 0 % (ref 0.0–0.2)

## 2018-11-08 LAB — ABO/RH: ABO/RH(D): B POS

## 2018-11-08 LAB — RPR: RPR Ser Ql: NONREACTIVE

## 2018-11-08 MED ORDER — PHENYLEPHRINE 40 MCG/ML (10ML) SYRINGE FOR IV PUSH (FOR BLOOD PRESSURE SUPPORT): 80.0000 ug | PREFILLED_SYRINGE | INTRAVENOUS | Status: DC | PRN

## 2018-11-08 MED ORDER — DIPHENHYDRAMINE HCL 50 MG/ML IJ SOLN: 12.5000 mg | INTRAMUSCULAR | Status: DC | PRN

## 2018-11-08 MED ORDER — OXYCODONE-ACETAMINOPHEN 5-325 MG PO TABS: 2.0000 | ORAL_TABLET | ORAL | Status: DC | PRN

## 2018-11-08 MED ORDER — SOD CITRATE-CITRIC ACID 500-334 MG/5ML PO SOLN: 30.0000 mL | ORAL | Status: DC | PRN

## 2018-11-08 MED ORDER — OXYTOCIN BOLUS FROM INFUSION
500.0000 mL | Freq: Once | INTRAVENOUS | Status: AC
  Administered 2018-11-08: 500 mL via INTRAVENOUS

## 2018-11-08 MED ORDER — PENICILLIN G 3 MILLION UNITS IVPB - SIMPLE MED
3.0000 10*6.[IU] | INTRAVENOUS | Status: DC
  Administered 2018-11-08 (×3): 3 10*6.[IU] via INTRAVENOUS
  Filled 2018-11-08 (×3): qty 100

## 2018-11-08 MED ORDER — TERBUTALINE SULFATE 1 MG/ML IJ SOLN: 0.2500 mg | Freq: Once | INTRAMUSCULAR | Status: DC | PRN

## 2018-11-08 MED ORDER — LACTATED RINGERS IV SOLN
500.0000 mL | Freq: Once | INTRAVENOUS | Status: AC
  Administered 2018-11-08: 500 mL via INTRAVENOUS

## 2018-11-08 MED ORDER — ACETAMINOPHEN 325 MG PO TABS: 650.0000 mg | ORAL_TABLET | ORAL | Status: DC | PRN

## 2018-11-08 MED ORDER — EPHEDRINE 5 MG/ML INJ: 10.0000 mg | INTRAVENOUS | Status: DC | PRN

## 2018-11-08 MED ORDER — LACTATED RINGERS IV SOLN: 500.0000 mL | INTRAVENOUS | Status: DC | PRN

## 2018-11-08 MED ORDER — SODIUM CHLORIDE 0.9 % IV SOLN
5.0000 10*6.[IU] | Freq: Once | INTRAVENOUS | Status: AC
  Administered 2018-11-08: 5 10*6.[IU] via INTRAVENOUS
  Filled 2018-11-08: qty 5

## 2018-11-08 MED ORDER — SODIUM CHLORIDE (PF) 0.9 % IJ SOLN
INTRAMUSCULAR | Status: DC | PRN
  Administered 2018-11-08: 12 mL/h via EPIDURAL

## 2018-11-08 MED ORDER — OXYCODONE-ACETAMINOPHEN 5-325 MG PO TABS: 1.0000 | ORAL_TABLET | ORAL | Status: DC | PRN

## 2018-11-08 MED ORDER — MISOPROSTOL 50MCG HALF TABLET
50.0000 ug | ORAL_TABLET | ORAL | Status: DC | PRN
  Administered 2018-11-08: 50 ug via ORAL

## 2018-11-08 MED ORDER — MISOPROSTOL 50MCG HALF TABLET
ORAL_TABLET | ORAL | Status: AC
  Filled 2018-11-08: qty 1

## 2018-11-08 MED ORDER — ONDANSETRON HCL 4 MG/2ML IJ SOLN: 4.0000 mg | Freq: Four times a day (QID) | INTRAMUSCULAR | Status: DC | PRN

## 2018-11-08 MED ORDER — FENTANYL-BUPIVACAINE-NACL 0.5-0.125-0.9 MG/250ML-% EP SOLN
12.0000 mL/h | EPIDURAL | Status: DC | PRN
  Filled 2018-11-08: qty 250

## 2018-11-08 MED ORDER — LIDOCAINE HCL (PF) 1 % IJ SOLN: 30.0000 mL | INTRAMUSCULAR | Status: DC | PRN

## 2018-11-08 MED ORDER — LIDOCAINE HCL (PF) 1 % IJ SOLN
INTRAMUSCULAR | Status: DC | PRN
  Administered 2018-11-08 (×2): 5 mL via EPIDURAL

## 2018-11-08 MED ORDER — OXYTOCIN 40 UNITS IN NORMAL SALINE INFUSION - SIMPLE MED
2.5000 [IU]/h | INTRAVENOUS | Status: DC
  Filled 2018-11-08: qty 1000

## 2018-11-08 MED ORDER — OXYTOCIN 40 UNITS IN NORMAL SALINE INFUSION - SIMPLE MED
1.0000 m[IU]/min | INTRAVENOUS | Status: DC
  Administered 2018-11-08: 2 m[IU]/min via INTRAVENOUS
  Administered 2018-11-08: 4 m[IU]/min via INTRAVENOUS
  Administered 2018-11-08: 8 m[IU]/min via INTRAVENOUS

## 2018-11-08 MED ORDER — LACTATED RINGERS IV SOLN
INTRAVENOUS | Status: DC
  Administered 2018-11-08 (×3): via INTRAVENOUS

## 2018-11-08 NOTE — Progress Notes (Signed)
FAYLINN SCHWENN is a 28 y.o. G2P1001 at [redacted]w[redacted]d presented for IOL for post-dates  S:  Tatiana is resting comfortable with her epidural. She is feeling her baby move, and is now feeling more contractions. No vaginal bleeding or discharge.   O:  Vitals:   11/08/18 1811 11/08/18 1832  BP: 116/62 (!) 123/57  Pulse: 77 96  Resp:    Temp:    SpO2:      CVE: Dilation: 5 Effacement (%): 80 Cervical Position: Posterior Station: -2 Presentation: Vertex Exam by:: Sia Johnny RN, Jennifer Cox RN  FHR: 135 bpm, moderate variability, accels, early decels  A&P: 28 y.o. G2P1001 [redacted]w[redacted]d presenting for IOL. #Labor: Progressing well. Currently on pitocin. S/p cytotec x1, SROM. #Pain: epidural #FWB: Fetal status reassuring  #GBS positive, receiving penicillin  Anticipate vaginal delivery CS as appropriate  Merilyn Baba, DO OB Fellow, [facility]

## 2018-11-08 NOTE — Progress Notes (Signed)
LABOR PROGRESS NOTE  [name] is a 28 y.o. G2P1001 at [redacted]w[redacted]d  admitted for IOL for postdates.   Subjective: Went to introduce myself to patient and FOB.   Patient comfortable with epidural. Sitting straight up.   Objective: BP (!) 92/53   Pulse 70   Temp 98.9 F (37.2 C) (Oral)   Resp 16   Ht 5\' 5"  (1.651 m)   Wt 60.3 kg   LMP 01/25/2018   SpO2 96%   BMI 22.13 kg/m  or  Vitals:   11/08/18 1811 11/08/18 1832 11/08/18 1859 11/08/18 1901  BP: 116/62 (!) 123/57 (!) 100/55 (!) 92/53  Pulse: 77 96 76 70  Resp:   16   Temp:   98.9 F (37.2 C)   TempSrc:   Oral   SpO2:      Weight:      Height:        Dilation: 8.5 Effacement (%): 100 Cervical Position: Posterior Station: -1 Presentation: Vertex Exam by:: Ola Spurr, RN FHT: baseline rate 125, moderate varibility, no acel, no decel Toco: q1-3 min   Labs: Lab Results  Component Value Date   WBC 6.5 11/08/2018   HGB 13.6 11/08/2018   HCT 40.4 11/08/2018   MCV 97.8 11/08/2018   PLT 255 11/08/2018    Patient Active Problem List   Diagnosis Date Noted  . Encounter for induction of labor 11/08/2018  . Post term pregnancy, 41 weeks 11/08/2018  . Group B Streptococcus carrier, +RV culture, currently pregnant 10/07/2018  . Rubella non-immune status, antepartum 10/05/2018  . History of third degree perineal laceration, no complications 53/66/4403  . Supervision of other normal pregnancy, antepartum 04/26/2018  . Abnormal Pap smear of cervix 05/26/2016  . 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 G2P1001 at [redacted]w[redacted]d here for IOL for postdates.   Labor: Progressing well. On pitocin at 8 mu/min.  Fetal Wellbeing:  Cat I  Pain Control:  Epidural in place  Anticipated MOD:  NSVD   Phill Myron, D.O. OB Fellow  11/08/2018, 9:06 PM

## 2018-11-08 NOTE — H&P (Addendum)
LABOR AND DELIVERY ADMISSION HISTORY AND PHYSICAL NOTE  [name] is a 28 y.o. female G2P1001 with IUP at 65w0dby 9W UKoreapresenting for IOL.  She reports positive fetal movement. She denies leakage of fluid or vaginal bleeding.  Prenatal History/Complications: PNC at [name]  Pregnancy complications:  -GBS +, no allergies -CF Carrier- NIPS low risk -Rubella non-immune  Past Medical History: Past Medical History:  Diagnosis Date  . Allergy   . Asthma   . Vaginal Pap smear, abnormal    +HPV, LSIL    Past Surgical History: Past Surgical History:  Procedure Laterality Date  . WISDOM TOOTH EXTRACTION Bilateral 2010    Obstetrical History: OB History    Gravida  2   Para  1   Term  1   Preterm  0   AB  0   Living  1     SAB  0   TAB  0   Ectopic  0   Multiple  0   Live Births  1           Social History: Social History   Socioeconomic History  . Marital status: Single    Spouse name: Not on file  . Number of children: 1  . Years of education: Not on file  . Highest education level: Not on file  Occupational History  . Not on file  Social Needs  . Financial resource strain: Not on file  . Food insecurity    Worry: Not on file    Inability: Not on file  . Transportation needs    Medical: Not on file    Non-medical: Not on file  Tobacco Use  . Smoking status: Never Smoker  . Smokeless tobacco: Never Used  Substance and Sexual Activity  . Alcohol use: No  . Drug use: No  . Sexual activity: Never  Lifestyle  . Physical activity    Days per week: Not on file    Minutes per session: Not on file  . Stress: Not on file  Relationships  . Social [place] phone: Not on file    Gets together: Not on file    Attends religious service: Not on file    Active member of club or organization: Not on file    Attends meetings of clubs or organizations: Not on file    Relationship status: Not on file  Other Topics Concern   . Not on file  Social History Narrative  . Not on file    Family History: Family History  Problem Relation Age of Onset  . COPD Mother   . Hypertension Father   . Seizures Brother   . Diabetes Paternal Grandmother   . Hypertension Paternal Grandmother   . Diabetes Paternal Grandfather   . Hypertension Paternal Grandfather   . Diabetes Paternal Aunt   . Diabetes Paternal Uncle   . Cancer Neg Hx     Allergies: Allergies  Allergen Reactions  . Banana Itching and Swelling  . Orange Fruit [Citrus] Itching and Swelling    Medications Prior to Admission  Medication Sig Dispense Refill Last Dose  . albuterol (PROVENTIL HFA;VENTOLIN HFA) 108 (90 Base) MCG/ACT inhaler Inhale 1-2 puffs into the lungs every 6 (six) hours as needed for wheezing or shortness of breath. 1 Inhaler 0   . Blood Pressure Monitor KIT 1 each by Does not apply route daily. 1 each 0   . omeprazole (PRILOSEC) 20 MG capsule Take 1  capsule (20 mg total) by mouth 2 (two) times daily before a meal. (Patient not taking: Reported on 10/20/2018) 60 capsule 5   . Pediatric Multiple Vit-C-FA (FLINSTONES GUMMIES OMEGA-3 DHA PO) Take by mouth.     . promethazine (PHENERGAN) 25 MG tablet Take 1 tablet (25 mg total) by mouth every 6 (six) hours as needed for nausea or vomiting. (Patient not taking: Reported on 10/05/2018) 30 tablet 0   . Spacer/Aero-Holding Chambers (VALVED HOLDING CHAMBER) DEVI 1 Device by Does not apply route as needed. (Patient not taking: Reported on 10/05/2018) 1 Device 0      Review of Systems  All systems reviewed and negative except as stated in HPI  Physical Exam Blood pressure 122/75, pulse (!) 107, temperature 98.8 F (37.1 C), temperature source Oral, height _0  (1.651 m), weight 60.3 kg, last menstrual period 01/25/2018, unknown if currently breastfeeding. General appearance: alert, oriented, NAD Lungs: normal respiratory effort Heart: regular rate Abdomen: soft, non-tender; gravid, FH  appropriate for GA Extremities: No calf swelling or tenderness Presentation: cephalic by Leopold's Fetal monitoring: baseline 140, multiple accelerations, moderate variability Uterine activity: irregular contractions Dilation: 3 Effacement (%): 50 Station: -3 Exam by:: Sia Johnny RN, Krystal Eaton RN  Prenatal labs: ABO, Rh: --/--/B POS (09/01 0810) Antibody: NEG (09/01 0810) Rubella: <0.90 (02/18 1312) RPR: Non Reactive (06/03 1019)  HBsAg: Negative (02/18 1312)  HIV: Non Reactive (06/03 1019)  GC/Chlamydia: negative GBS: Positive (07/29 1015)  2-hr GTT: normal Genetic screening:  NIPS: low risk female, AFP: normal, CF carrier Anatomy US: normal  Prenatal Transfer Tool  Maternal Diabetes: No Genetic Screening: Normal Maternal Ultrasounds/Referrals: Normal Fetal Ultrasounds or other Referrals:  None Maternal Substance Abuse:  No Significant Maternal Medications:  None Significant Maternal Lab Results: Group B Strep positive  Results for orders placed or performed during the hospital encounter of 11/08/18 (from the past 24 hour(s))  CBC   Collection Time: 11/08/18  8:00 AM  Result Value Ref Range   WBC 6.5 4.0 - 10.5 K/uL   RBC 4.13 3.87 - 5.11 MIL/uL   Hemoglobin 13.6 12.0 - 15.0 g/dL   HCT 40.4 36.0 - 46.0 %   MCV 97.8 80.0 - 100.0 fL   MCH 32.9 26.0 - 34.0 pg   MCHC 33.7 30.0 - 36.0 g/dL   RDW 13.9 11.5 - 15.5 %   Platelets 255 150 - 400 K/uL   nRBC 0.0 0.0 - 0.2 %  Type and screen   Collection Time: 11/08/18  8:10 AM  Result Value Ref Range   ABO/RH(D) B POS    Antibody Screen NEG    Sample Expiration      11/11/2018,2359 Performed at [facility], [address], [place], [place] [address]     Patient Active Problem List   Diagnosis Date Noted  . Encounter for induction of labor 11/08/2018  . Post term pregnancy, 41 weeks 11/08/2018  . Group B Streptococcus carrier, +RV culture, currently pregnant 10/07/2018  . Rubella non-immune status,  antepartum 10/05/2018  . History of third degree perineal laceration, no complications 76/22/6333  . Supervision of other normal pregnancy, antepartum 04/26/2018  . Abnormal Pap smear of cervix 05/26/2016  . 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 G2P1001 at 38w0dhere for IOL.  #Labor: cervix unfavorable, will give Cytotec and recheck in 4 hours #Pain: Well controlled, planning to get epidural #FWB: category 1 tracing, fetal status reassuring #  ID: Covid-19 negative #MOF: breast and bottle #MOC: none #Rubella non-immune- needs MMR post partum  Phylliss Blakes 11/08/2018, 9:29 AM   GME ATTESTATION:  I saw and evaluated the patient. I agree with the findings and the plan of care as documented in the resident's note.  Merilyn Baba, DO OB Fellow [place] for [place] 11/08/2018, 12:56 PM

## 2018-11-08 NOTE — Progress Notes (Addendum)
Labor Progress Note [name] is a 28 y.o. G2P1001 at [redacted]w[redacted]d presented for IOL  S: Patient has no complaints at this time. Endorses positive fetal movement. No vaginal bleeding or discharge.   O:  BP 113/70   Pulse 83   Temp 98.6 F (37 C) (Oral)   Resp 16   Ht 5\' 5"  (1.651 m)   Wt 60.3 kg   LMP 01/25/2018   BMI 22.13 kg/m  EFM: multiple accelerations, moderate variability, baseline 135  CVE: Dilation: 3 Effacement (%): 50 Cervical Position: Posterior Station: -3 Presentation: Vertex Exam by:: Sia Johnny RN, Krystal Eaton RN   A&P: 28 y.o. G2P1001 [redacted]w[redacted]d presenting for IOL. #Labor: Progressing well. Currently on pitocin #Pain: well controlled, planning to get epidural #FWB: category 1 tracing, fetal status reassuring  #GBS positive, receiving penicillin  [name], Medical Student 3:28 PM   GME ATTESTATION:  I saw and evaluated the patient with the medical student. I agree with the findings and the plan of care as documented in the medical student's note.  Merilyn Baba, DO OB Fellow [place] for [employer]

## 2018-11-08 NOTE — Anesthesia Preprocedure Evaluation (Signed)
Anesthesia Evaluation  Patient identified by MRN, date of birth, ID band Patient awake    Reviewed: Allergy & Precautions, H&P , NPO status , Patient's Chart, lab work & pertinent test results  History of Anesthesia Complications Negative for: history of anesthetic complications  Airway Mallampati: II  TM Distance: >3 FB Neck ROM: full    Dental no notable dental hx. (+) Teeth Intact   Pulmonary asthma ,    Pulmonary exam normal breath sounds clear to auscultation       Cardiovascular negative cardio ROS Normal cardiovascular exam Rhythm:regular Rate:Normal     Neuro/Psych negative neurological ROS  negative psych ROS   GI/Hepatic negative GI ROS, Neg liver ROS,   Endo/Other  negative endocrine ROS  Renal/GU negative Renal ROS  negative genitourinary   Musculoskeletal   Abdominal   Peds  Hematology negative hematology ROS (+)   Anesthesia Other Findings   Reproductive/Obstetrics (+) Pregnancy                             Anesthesia Physical Anesthesia Plan  ASA: II  Anesthesia Plan: Epidural   Post-op Pain Management:    Induction:   PONV Risk Score and Plan:   Airway Management Planned:   Additional Equipment:   Intra-op Plan:   Post-operative Plan:   Informed Consent: I have reviewed the patients History and Physical, chart, labs and discussed the procedure including the risks, benefits and alternatives for the proposed anesthesia with the patient or authorized representative who has indicated his/her understanding and acceptance.     Plan Discussed with:   Anesthesia Plan Comments:         Anesthesia Quick Evaluation  

## 2018-11-08 NOTE — Anesthesia Procedure Notes (Signed)
Epidural Patient location during procedure: OB Start time: 11/08/2018 5:13 PM End time: 11/08/2018 5:23 PM  Staffing Anesthesiologist: Murvin Natal, MD Performed: anesthesiologist   Preanesthetic Checklist Completed: patient identified, site marked, pre-op evaluation, timeout performed, IV checked, risks and benefits discussed and monitors and equipment checked  Epidural Patient position: sitting Prep: DuraPrep Patient monitoring: heart rate, cardiac monitor, continuous pulse ox and blood pressure Approach: midline Location: L4-L5 Injection technique: LOR air  Needle:  Needle type: Tuohy  Needle gauge: 17 G Needle length: 9 cm Needle insertion depth: 8 cm Catheter type: closed end flexible Catheter size: 19 Gauge Catheter at skin depth: 12 cm Test dose: negative and Other  Assessment Events: blood not aspirated, injection not painful, no injection resistance, negative IV test and paresthesia  Additional Notes Informed consent obtained prior to proceeding including risk of failure, 1% risk of PDPH, risk of minor discomfort and bruising.  Discussed alternatives to epidural analgesia and patient desires to proceed.  Timeout performed pre-procedure verifying patient name, procedure, and platelet count.  Slight transient left sided paresthesia during threading of the catheter. Immediately resolved with slight rightward re-direction. Patient tolerated procedure well. Reason for block:procedure for pain

## 2018-11-09 ENCOUNTER — Other Ambulatory Visit: Payer: Self-pay

## 2018-11-09 ENCOUNTER — Encounter (HOSPITAL_COMMUNITY): Payer: Self-pay

## 2018-11-09 DIAGNOSIS — O480 Post-term pregnancy: Secondary | ICD-10-CM

## 2018-11-09 DIAGNOSIS — Z3A41 41 weeks gestation of pregnancy: Secondary | ICD-10-CM

## 2018-11-09 DIAGNOSIS — O99824 Streptococcus B carrier state complicating childbirth: Secondary | ICD-10-CM

## 2018-11-09 MED ORDER — TETANUS-DIPHTH-ACELL PERTUSSIS 5-2.5-18.5 LF-MCG/0.5 IM SUSP: 0.5000 mL | Freq: Once | INTRAMUSCULAR | Status: DC

## 2018-11-09 MED ORDER — SENNOSIDES-DOCUSATE SODIUM 8.6-50 MG PO TABS
2.0000 | ORAL_TABLET | ORAL | Status: DC
  Administered 2018-11-09: 2 via ORAL
  Filled 2018-11-09: qty 2

## 2018-11-09 MED ORDER — DIBUCAINE (PERIANAL) 1 % EX OINT: 1.0000 "application " | TOPICAL_OINTMENT | CUTANEOUS | Status: DC | PRN

## 2018-11-09 MED ORDER — DIPHENHYDRAMINE HCL 25 MG PO CAPS: 25.0000 mg | ORAL_CAPSULE | Freq: Four times a day (QID) | ORAL | Status: DC | PRN

## 2018-11-09 MED ORDER — TRANEXAMIC ACID-NACL 1000-0.7 MG/100ML-% IV SOLN
INTRAVENOUS | Status: AC
  Administered 2018-11-09: 1000 mg
  Filled 2018-11-09: qty 100

## 2018-11-09 MED ORDER — IBUPROFEN 600 MG PO TABS
600.0000 mg | ORAL_TABLET | Freq: Four times a day (QID) | ORAL | Status: DC
  Administered 2018-11-09 – 2018-11-10 (×6): 600 mg via ORAL
  Filled 2018-11-09 (×6): qty 1

## 2018-11-09 MED ORDER — MEASLES, MUMPS & RUBELLA VAC IJ SOLR
0.5000 mL | Freq: Once | INTRAMUSCULAR | Status: AC
  Administered 2018-11-10: 0.5 mL via SUBCUTANEOUS
  Filled 2018-11-09: qty 0.5

## 2018-11-09 MED ORDER — COCONUT OIL OIL: 1.0000 "application " | TOPICAL_OIL | Status: DC | PRN

## 2018-11-09 MED ORDER — WITCH HAZEL-GLYCERIN EX PADS: 1.0000 "application " | MEDICATED_PAD | CUTANEOUS | Status: DC | PRN

## 2018-11-09 MED ORDER — ZOLPIDEM TARTRATE 5 MG PO TABS: 5.0000 mg | ORAL_TABLET | Freq: Every evening | ORAL | Status: DC | PRN

## 2018-11-09 MED ORDER — SIMETHICONE 80 MG PO CHEW: 80.0000 mg | CHEWABLE_TABLET | ORAL | Status: DC | PRN

## 2018-11-09 MED ORDER — PRENATAL MULTIVITAMIN CH
1.0000 | ORAL_TABLET | Freq: Every day | ORAL | Status: DC
  Administered 2018-11-09 – 2018-11-10 (×2): 1 via ORAL
  Filled 2018-11-09 (×2): qty 1

## 2018-11-09 MED ORDER — TRANEXAMIC ACID-NACL 1000-0.7 MG/100ML-% IV SOLN: 1000.0000 mg | INTRAVENOUS | Status: AC

## 2018-11-09 MED ORDER — PNEUMOCOCCAL VAC POLYVALENT 25 MCG/0.5ML IJ INJ
0.5000 mL | INJECTION | INTRAMUSCULAR | Status: AC
  Administered 2018-11-10: 0.5 mL via INTRAMUSCULAR
  Filled 2018-11-09: qty 0.5

## 2018-11-09 MED ORDER — BENZOCAINE-MENTHOL 20-0.5 % EX AERO
1.0000 "application " | INHALATION_SPRAY | CUTANEOUS | Status: DC | PRN
  Administered 2018-11-09 (×2): 1 via TOPICAL
  Filled 2018-11-09 (×2): qty 56

## 2018-11-09 MED ORDER — ACETAMINOPHEN 325 MG PO TABS: 650.0000 mg | ORAL_TABLET | ORAL | Status: DC | PRN

## 2018-11-09 MED ORDER — TRANEXAMIC ACID-NACL 1000-0.7 MG/100ML-% IV SOLN: 1000.0000 mg | Freq: Once | INTRAVENOUS | Status: DC | PRN

## 2018-11-09 MED ORDER — ONDANSETRON HCL 4 MG PO TABS: 4.0000 mg | ORAL_TABLET | ORAL | Status: DC | PRN

## 2018-11-09 MED ORDER — ONDANSETRON HCL 4 MG/2ML IJ SOLN: 4.0000 mg | INTRAMUSCULAR | Status: DC | PRN

## 2018-11-09 NOTE — Progress Notes (Signed)
Patient given MMR and pneumococcal VIS.

## 2018-11-09 NOTE — Anesthesia Postprocedure Evaluation (Signed)
Anesthesia Post Note  Patient: [name]  Procedure(s) Performed: AN AD [place]     Patient location during evaluation: Mother Baby Anesthesia Type: Epidural Level of consciousness: awake and alert Pain management: pain level controlled Vital Signs Assessment: post-procedure vital signs reviewed and stable Respiratory status: spontaneous breathing, nonlabored ventilation and respiratory function stable Cardiovascular status: stable Postop Assessment: no headache, no backache and epidural receding Anesthetic complications: no    Last Vitals:  Vitals:   11/09/18 1057 11/09/18 1504  BP: 108/73 (!) 95/50  Pulse: 86 75  Resp: 18 18  Temp: 37.4 C 37.3 C  SpO2:      Last Pain:  Vitals:   11/09/18 1505  TempSrc:   PainSc: 0-No pain   Pain Goal:                   [employer]

## 2018-11-09 NOTE — Lactation Note (Addendum)
This note was copied from a baby's chart. Lactation Consultation Note  Patient Name: [name] [id number] Date: 11/09/2018 Reason for consult: Term P2, 3 hour female infant. Infant had two stools since birth. Mom is active participant in the [facility] program in [place]. Per mom, she breastfeed her son for 3 weeks she is unsure how long she plans to breastfeed infant. Mom doesn't have breast pump at home, [facility] gave hand pump prn. Mom shown how to use hand pump & how to disassemble, clean, & reassemble parts. Per mom, infant did not latch in L&D. Mom latched infant on left breast using cross cradle hold, infant latched with wide mouth, nose and chin touching breast. Infant breastfeeding 30 minutes and was still feeding when [facility] left room. Mom knows to breastfeed according hunger cues, 8 to 12 times within 24 hours and on demand. Mom knows to do STS. Reviewed Baby & Me book's Breastfeeding Basics.  Mom made aware of O/P services, breastfeeding support groups, community resources, and our phone # for post-discharge questions.   Maternal Data Formula Feeding for Exclusion: Yes Reason for exclusion: Mother's choice to formula and breast feed on admission Has patient been taught Hand Expression?: Yes Does the patient have breastfeeding experience prior to this delivery?: Yes  Feeding Feeding Type: Breast Fed  LATCH Score Latch: Grasps breast easily, tongue down, lips flanged, rhythmical sucking.  Audible Swallowing: Spontaneous and intermittent  Type of Nipple: Everted at rest and after stimulation  Comfort (Breast/Nipple): Soft / non-tender  Hold (Positioning): Assistance needed to correctly position infant at breast and maintain latch.  LATCH Score: 9  Interventions Interventions: Breast feeding basics reviewed;Breast compression;Adjust position;Assisted with latch;Skin to skin;Support pillows;Hand pump;Position options;Breast massage;Hand express  Lactation Tools  Discussed/Used WIC Program: Yes Pump Review: Setup, frequency, and cleaning;Milk Storage Initiated by:: Vicente Serene, IBCLC Date initiated:: 11/09/18   Consult Status      [name] 11/09/2018, 3:05 AM

## 2018-11-09 NOTE — Progress Notes (Signed)
Post Partum Day 1 Subjective: no complaints, up ad lib, voiding, tolerating PO and + flatus  She is currently breastfeeding only.   Objective: Blood pressure 108/73, pulse 86, temperature 99.3 F (37.4 C), temperature source Oral, resp. rate 18, height _0  (1.651 m), weight 60.3 kg, last menstrual period 01/25/2018, SpO2 100 %, unknown if currently breastfeeding.  Physical Exam:  General: alert, cooperative, appears stated age and no distress Lochia: appropriate Uterine Fundus: firm DVT Evaluation: No evidence of DVT seen on physical exam.  Recent Labs    11/08/18 0800  HGB 13.6  HCT 40.4    Assessment/Plan: Plan for discharge tomorrow, Breastfeeding and Contraception Depo prior to DC  [place] non-immune; MMR prior to DC Vitals stable   LOS: 1 day   Chauncey Mann 11/09/2018, 11:53 AM

## 2018-11-09 NOTE — Discharge Summary (Signed)
OB Discharge Summary     Patient Name: [name] DOB: 08-19-1990 MRN: [id number]  Date of admission: 11/08/2018 Delivering MD: Nicolette Bang   Date of discharge: 11/10/2018  Admitting diagnosis: pregnancy Intrauterine pregnancy: [redacted]w[redacted]d    Secondary diagnosis:  Active Problems:   SVD (spontaneous vaginal delivery)   Rubella non-immune status, antepartum   Group B Streptococcus carrier, +RV culture, currently pregnant   Encounter for induction of labor   Post term pregnancy, 41 weeks  Additional problems: N/A     Discharge diagnosis: Term Pregnancy Delivered                                                                                                Post partum procedures:N/A  Augmentation: Pitocin, Cytotec and Foley Balloon  Complications: None  Hospital course:  Induction of Labor With Vaginal Delivery   28y.o. yo G2P1001 at 457w1das admitted to the hospital 11/08/2018 for induction of labor.  Indication for induction: Postdates.  Patient had an uncomplicated labor course as follows: Membrane Rupture Time/Date: 6:02 PM ,11/08/2018   Intrapartum Procedures: Episiotomy: None [1]                                         Lacerations:  2nd degree [3];Vaginal [6];Labial [10]  Patient had delivery of a Viable infant.  Information for the patient's newborn:  HeBrithany, Whitworth0[[id number]]    11/08/2018  Details of delivery can be found in separate delivery note.  Patient had a routine postpartum course. Patient is discharged home 11/10/18.  Physical exam  Vitals:   11/08/18 1832 11/08/18 1859 11/08/18 1901 11/08/18 2219  BP: (!) 123/57 (!) 100/55 (!) 92/53   Pulse: 96 76 70   Resp:  16    Temp:  98.9 F (37.2 C)  97.9 F (36.6 C)  TempSrc:  Oral  Oral  SpO2:      Weight:      Height:       General: alert, cooperative and no distress Lochia: appropriate Uterine Fundus: firm Incision: N/A DVT Evaluation: No evidence of DVT seen on physical  exam. Labs: Lab Results  Component Value Date   WBC 6.5 11/08/2018   HGB 13.6 11/08/2018   HCT 40.4 11/08/2018   MCV 97.8 11/08/2018   PLT 255 11/08/2018   CMP 12/30/2006  Glucose 87  BUN 13  Sodium 141  Potassium 4.0  Chloride 104    Discharge instruction: per After Visit Summary and "Baby and Me Booklet".  After visit meds:  Allergies as of 11/10/2018      Reactions   Banana Itching, Swelling   Orange Fruit [citrus] Itching, Swelling      Medication List    STOP taking these medications   omeprazole 20 MG capsule Commonly known as: PriLOSEC   promethazine 25 MG tablet Commonly known as: PHScientist, research (medical)   TAKE these medications   albuterol 108 (90 Base)  MCG/ACT inhaler Commonly known as: VENTOLIN HFA Inhale 1-2 puffs into the lungs every 6 (six) hours as needed for wheezing or shortness of breath.   benzocaine-Menthol 20-0.5 % Aero Commonly known as: DERMOPLAST Apply 1 application topically as needed for irritation (perineal discomfort).   Blood Pressure Monitor Kit 1 each by Does not apply route daily.   calcium carbonate 500 MG chewable tablet Commonly known as: TUMS - dosed in mg elemental calcium Chew 3-4 tablets by mouth as needed for indigestion or heartburn.   dibucaine 1 % Oint Commonly known as: NUPERCAINAL Place 1 application rectally as needed for hemorrhoids.   FLINSTONES GUMMIES OMEGA-3 DHA PO Take 1 tablet by mouth daily.   witch hazel-glycerin pad Commonly known as: TUCKS Apply 1 application topically as needed for hemorrhoids.       Diet: routine diet  Activity: Advance as tolerated. Pelvic rest for 6 weeks.   Outpatient follow up:4 weeks Follow up Appt: Future Appointments  Date Time Provider [place]  12/13/2018 10:15 AM Gavin Pound, CNM [facility] None   Please schedule this patient for Postpartum visit in: 4 weeks with the following provider: Any provider For C/S patients schedule nurse  incision check in weeks 2 weeks: no Low risk pregnancy complicated by: h/o 3rd degree lac, rubella non-immune  Delivery mode:  SVD Anticipated Birth Control:  Depo PP Procedures needed: None  Schedule Integrated BH visit: no  Postpartum contraception: Depo Provera (declined inpatient Depo)  Newborn Data: Live born female  Birth Weight:  3314g  APGAR: 10, 9  Newborn Delivery   Birth date/time: 11/08/2018 23:42:00 Delivery type: Vaginal, Spontaneous      Baby Feeding: Bottle and Breast Disposition:home with mother  MMR and PNI-Immune administered 11/09/18  Mallie Snooks, MSN, CNM Certified Nurse Midwife, [facility] for [employer], [place] 11/10/18 9:51 AM

## 2018-11-09 NOTE — Anesthesia Postprocedure Evaluation (Signed)
Anesthesia Post Note  Patient: [name]  Procedure(s) Performed: AN AD [place]     Patient location during evaluation: Mother Baby Anesthesia Type: Epidural Comments: Attempted to contact pt but no answer in room;will attempt again later today    Last Vitals:  Vitals:   11/09/18 0303 11/09/18 0749  BP: 114/68 104/68  Pulse: 81 82  Resp: 18 18  Temp: 37.2 C 36.8 C  SpO2: 100%     Last Pain:  Vitals:   11/09/18 0749  TempSrc: Oral  PainSc: 0-No pain   Pain Goal:                Epidural/Spinal Function Cutaneous sensation: Normal sensation (11/09/18 0749), Patient able to flex knees: Yes (11/09/18 0749), Patient able to lift hips off bed: Yes (11/09/18 0749), Back pain beyond tenderness at insertion site: No (11/09/18 0749), Progressively worsening motor and/or sensory loss: No (11/09/18 0749), Bowel and/or bladder incontinence post epidural: No (11/09/18 0749)  Everette Rank

## 2018-11-10 MED ORDER — WITCH HAZEL-GLYCERIN EX PADS: 1.0000 "application " | MEDICATED_PAD | CUTANEOUS | 12 refills | Status: DC | PRN

## 2018-11-10 MED ORDER — BENZOCAINE-MENTHOL 20-0.5 % EX AERO: 1.0000 "application " | INHALATION_SPRAY | CUTANEOUS | 0 refills | Status: DC | PRN

## 2018-11-10 MED ORDER — DIBUCAINE (PERIANAL) 1 % EX OINT: 1.0000 "application " | TOPICAL_OINTMENT | CUTANEOUS | 0 refills | Status: DC | PRN

## 2018-11-10 NOTE — Discharge Instructions (Signed)

## 2018-11-10 NOTE — Lactation Note (Signed)
This note was copied from a baby's chart. Lactation Consultation Note  Patient Name: [name] [id number] Date: 11/10/2018   P2, Ex BF.  Latched upon entering with sucks and swallows.  Feed on demand with cues.  Goal 8-12+ times per day after first 24 hrs.  Place baby STS if not cueing.  Reviewed engorgement care and monitoring voids/stools.      Maternal Data    Feeding Feeding Type: Breast Fed  LATCH Score                   Interventions    Lactation Tools Discussed/Used     Consult Status      Carlye Grippe 11/10/2018, 9:09 AM

## 2018-11-15 ENCOUNTER — Other Ambulatory Visit: Payer: Self-pay

## 2018-11-15 ENCOUNTER — Telehealth: Payer: Self-pay

## 2018-11-15 NOTE — Telephone Encounter (Signed)
Patient is Postpartum 1 week. She has c/o constipation, would like to know if theres something we can send for her to take. Pt is currently breastfeeding.

## 2018-11-16 ENCOUNTER — Other Ambulatory Visit: Payer: Self-pay | Admitting: Obstetrics

## 2018-11-16 DIAGNOSIS — K5901 Slow transit constipation: Secondary | ICD-10-CM

## 2018-11-16 MED ORDER — POLYETHYLENE GLYCOL 3350 17 G PO PACK: PACK | ORAL | 5 refills | Status: DC

## 2018-11-16 MED ORDER — DOCUSATE SODIUM 100 MG PO CAPS: 100.0000 mg | ORAL_CAPSULE | Freq: Two times a day (BID) | ORAL | 5 refills | Status: DC

## 2018-12-13 ENCOUNTER — Telehealth: Payer: Medicaid Other

## 2018-12-13 ENCOUNTER — Other Ambulatory Visit: Payer: Self-pay

## 2018-12-13 NOTE — Progress Notes (Signed)
Called pt for mychart visit, patient answered questions, but did not sign on to speak with provider, called pt again, no answer, left vm.    Hinton Lovely, RN 12/13/2018 10:21 AM

## 2018-12-14 NOTE — Progress Notes (Signed)
Patient at office, but had infant and could not be seen in person.  Patient reports desire for no birth control and appt changed to virtual visit.  Patient not in virtual appt when provider connected and did not answer the phone when called by nurse.  Maryann Conners MSN, CNM Advanced Practice Provider, Center for [employer]

## 2019-01-16 ENCOUNTER — Other Ambulatory Visit: Payer: Self-pay

## 2019-01-16 ENCOUNTER — Encounter (HOSPITAL_COMMUNITY): Payer: Self-pay | Admitting: Emergency Medicine

## 2019-01-16 ENCOUNTER — Emergency Department (HOSPITAL_COMMUNITY)
Admission: EM | Admit: 2019-01-16 | Discharge: 2019-01-16 | Disposition: A | Discharge status: Home / Self Care | Payer: Medicaid Other | Attending: Emergency Medicine | Admitting: Emergency Medicine

## 2019-01-16 DIAGNOSIS — L03012 Cellulitis of left finger: Secondary | ICD-10-CM | POA: Diagnosis not present

## 2019-01-16 DIAGNOSIS — J45909 Unspecified asthma, uncomplicated: Secondary | ICD-10-CM | POA: Insufficient documentation

## 2019-01-16 DIAGNOSIS — Z79899 Other long term (current) drug therapy: Secondary | ICD-10-CM | POA: Diagnosis not present

## 2019-01-16 DIAGNOSIS — M79642 Pain in left hand: Secondary | ICD-10-CM | POA: Diagnosis present

## 2019-01-16 MED ORDER — MUPIROCIN CALCIUM 2 % EX CREA: 1.0000 "application " | TOPICAL_CREAM | Freq: Two times a day (BID) | CUTANEOUS | 0 refills | Status: DC

## 2019-01-16 MED ORDER — LIDOCAINE HCL (PF) 1 % IJ SOLN
10.0000 mL | Freq: Once | INTRAMUSCULAR | Status: AC
  Administered 2019-01-16: 5 mL via INTRADERMAL
  Filled 2019-01-16: qty 10

## 2019-01-16 MED ORDER — BACITRACIN ZINC 500 UNIT/GM EX OINT
TOPICAL_OINTMENT | Freq: Once | CUTANEOUS | Status: AC
  Administered 2019-01-16: 14:00:00 via TOPICAL
  Filled 2019-01-16: qty 0.9

## 2019-01-16 NOTE — ED Notes (Signed)
Patient verbalized understanding of dc instructions, vss, ambulatory with nad.   

## 2019-01-16 NOTE — ED Provider Notes (Signed)
[facility] EMERGENCY DEPARTMENT Provider Note   CSN: [id number] Arrival date & time: 01/16/19  1046     History   Chief Complaint Chief Complaint  Patient presents with  . Hand Pain    HPI [name] is a 28 y.o. female who presents for evaluation of pain and swelling to the left middle finger x 4 days.  Patient reports that she did not have any preceding trauma, injury.  She noticed the finger while she was driving.  She does report that she intermittently bites her nails.  No recent pedicures.  She tried doing Vaseline and over-the-counter antibiotic ointment with no improvement in symptoms.  She denies any fevers, chills, numbness/weakness.      The history is provided by the patient.    Past Medical History:  Diagnosis Date  . Allergy   . Asthma   . Vaginal Pap smear, abnormal    +HPV, LSIL    Patient Active Problem List   Diagnosis Date Noted  . Encounter for induction of labor 11/08/2018  . Post term pregnancy, 41 weeks 11/08/2018  . Group B Streptococcus carrier, +RV culture, currently pregnant 10/07/2018  . Rubella non-immune status, antepartum 10/05/2018  . History of third degree perineal laceration, no complications 67/59/1638  . Supervision of other normal pregnancy, antepartum 04/26/2018  . Abnormal Pap smear of cervix 05/26/2016  . SVD (spontaneous vaginal delivery) 05/31/2015  . 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 WISDOM TOOTH EXTRACTION Bilateral 2010     OB History    Gravida  2   Para  2   Term  2   Preterm  0   AB  0   Living  2     SAB  0   TAB  0   Ectopic  0   Multiple  0   Live Births  2            Home Medications    Prior to Admission medications   Medication Sig Start Date End Date Taking? Authorizing Provider  albuterol (PROVENTIL HFA;VENTOLIN HFA) 108 (90 Base) MCG/ACT inhaler Inhale 1-2 puffs into the  lungs every 6 (six) hours as needed for wheezing or shortness of breath. Patient not taking: Reported on 12/13/2018 08/08/16   Lorin Glass, PA-C  benzocaine-Menthol (DERMOPLAST) 20-0.5 % AERO Apply 1 application topically as needed for irritation (perineal discomfort). Patient not taking: Reported on 12/13/2018 11/10/18   Darlina Rumpf, CNM  Blood Pressure Monitor KIT 1 each by Does not apply route daily. Patient not taking: Reported on 12/13/2018 07/25/18 01/21/19  Shelly Bombard, MD  calcium carbonate (TUMS - DOSED IN MG ELEMENTAL CALCIUM) 500 MG chewable tablet Chew 3-4 tablets by mouth as needed for indigestion or heartburn.    [provider]  dibucaine (NUPERCAINAL) 1 % OINT Place 1 application rectally as needed for hemorrhoids. Patient not taking: Reported on 12/13/2018 11/10/18   Darlina Rumpf, CNM  docusate sodium (COLACE) 100 MG capsule Take 1 capsule (100 mg total) by mouth 2 (two) times daily. Patient not taking: Reported on 12/13/2018 11/16/18   Shelly Bombard, MD  mupirocin cream (BACTROBAN) 2 % Apply 1 application topically 2 (two) times daily. 01/16/19   Volanda Napoleon, PA-C  Pediatric Multiple Vit-C-FA (FLINSTONES GUMMIES OMEGA-3 DHA PO) Take 1 tablet by mouth daily.     [provider]  polyethylene glycol (MIRALAX) 17  g packet Take 1 packet dissolved in full glass of water at bedtime as needed for constipation. Patient not taking: Reported on 12/13/2018 11/16/18   Shelly Bombard, MD  witch hazel-glycerin (TUCKS) pad Apply 1 application topically as needed for hemorrhoids. Patient not taking: Reported on 12/13/2018 11/10/18   Darlina Rumpf, CNM    Family History Family History  Problem Relation Age of Onset  . COPD Mother   . Hypertension Father   . Seizures Brother   . Diabetes Paternal Grandmother   . Hypertension Paternal Grandmother   . Diabetes Paternal Grandfather   . Hypertension Paternal Grandfather   . Diabetes Paternal  Aunt   . Diabetes Paternal Uncle   . Cancer Neg Hx     Social History Social History   Tobacco Use  . Smoking status: Never Smoker  . Smokeless tobacco: Never Used  Substance Use Topics  . Alcohol use: No  . Drug use: No     Allergies   Banana and Orange fruit [citrus]   Review of Systems Review of Systems  Constitutional: Negative for fever.  Skin: Positive for color change.  Neurological: Negative for weakness.  All other systems reviewed and are negative.    Physical Exam Updated Vital Signs BP 112/80   Pulse 71   Temp 98.6 F (37 C) (Oral)   Resp 15   SpO2 99%   Physical Exam Vitals signs and nursing note reviewed.  Constitutional:      Appearance: She is well-developed.  HENT:     Head: Normocephalic and atraumatic.  Eyes:     General: No scleral icterus.       Right eye: No discharge.        Left eye: No discharge.     Conjunctiva/sclera: Conjunctivae normal.  Cardiovascular:     Pulses:          Radial pulses are 2+ on the right side and 2+ on the left side.  Pulmonary:     Effort: Pulmonary effort is normal.  Musculoskeletal:     Comments: FROM of all five digits of left hand without any difficulty.   Skin:    General: Skin is warm and dry.     Capillary Refill: Capillary refill takes less than 2 seconds.     Comments: Good distal cap refill.  LUE is not dusky in appearance or cool to touch.  Area of swelling, erythema and fluctuance noted to the lateral proximal nailbed of the left third digit consistent with paronychia.  Neurological:     Mental Status: She is alert.     Comments: Sensation intact along major nerve distributions of BUE  Psychiatric:        Speech: Speech normal.        Behavior: Behavior normal.      ED Treatments / Results  Labs (all labs ordered are listed, but only abnormal results are displayed) Labs Reviewed - No data to display  EKG None  Radiology No results found.  Procedures .Marland KitchenIncision and Drainage   Date/Time: 01/16/2019 1:51 PM Performed by: Volanda Napoleon, PA-C Authorized by: Volanda Napoleon, PA-C   Consent:    Consent obtained:  Verbal   Consent given by:  Patient   Risks discussed:  Bleeding, incomplete drainage, pain and damage to other organs   Alternatives discussed:  No treatment Universal protocol:    Procedure explained and questions answered to patient or proxy's satisfaction: yes     Relevant documents present and  verified: yes     Test results available and properly labeled: yes     Imaging studies available: yes     Required blood products, implants, devices, and special equipment available: yes     Site/side marked: yes     Immediately prior to procedure a time out was called: yes     Patient identity confirmed:  Verbally with patient Location:    Indications for incision and drainage: paronychia.   Size:  1   Location:  Upper extremity   Upper extremity location:  Finger   Finger location:  L long finger Pre-procedure details:    Skin preparation:  Betadine Anesthesia (see MAR for exact dosages):    Anesthesia method:  Nerve block   Block needle gauge:  25 G   Block anesthetic:  Lidocaine 1% w/o epi   Block injection procedure:  Anatomic landmarks identified, introduced needle, incremental injection and negative aspiration for blood Procedure type:    Complexity:  Complex Procedure details:    Incision types:  Single straight   Incision depth:  Subcutaneous   Scalpel blade:  11   Wound management:  Probed and deloculated, irrigated with saline and extensive cleaning   Drainage:  Purulent   Drainage amount:  Moderate Post-procedure details:    Patient tolerance of procedure:  Tolerated well, no immediate complications   (including critical care time)  Medications Ordered in ED Medications  lidocaine (PF) (XYLOCAINE) 1 % injection 10 mL (5 mLs Intradermal Given 01/16/19 1323)  bacitracin ointment ( Topical Given 01/16/19 1412)     Initial  Impression / Assessment and Plan / ED Course  I have reviewed the triage vital signs and the nursing notes.  Pertinent labs & imaging results that were available during my care of the patient were reviewed by me and considered in my medical decision making (see chart for details).        28 year old female who presents for evaluation of pain, redness, swelling to left middle finger x4 days.  No preceding trauma, injury, fall.  No fevers, numbness/weakness.  Initial ED arrival, she is afebrile, nontoxic-appearing.  Vital signs are stable.  On exam, she has pain, redness, swelling with an area of fluctuance noted to the proximal lateral nail bed consistent with paronychia.  History/physical exam not concerning for flexor tenosynovitis, felon.  Plan for drainage.  Paronychia drained as documented above.  Patient tolerated procedure well.  Will give hair topical Bactroban.  Encouraged at home supportive care measures. At this time, patient exhibits no emergent life-threatening condition that require further evaluation in ED or admission. Patient had ample opportunity for questions and discussion. All patient's questions were answered with full understanding. Strict return precautions discussed. Patient expresses understanding and agreement to plan.   Portions of this note were generated with Lobbyist. Dictation errors may occur despite best attempts at proofreading.   Final Clinical Impressions(s) / ED Diagnoses   Final diagnoses:  Paronychia of finger of left hand    ED Discharge Orders         Ordered    mupirocin cream (BACTROBAN) 2 %  2 times daily     01/16/19 1351           Volanda Napoleon, PA-C 01/16/19 [place], Ramer, DO 01/17/19 1534

## 2019-01-16 NOTE — Discharge Instructions (Signed)
You can take Tylenol or Ibuprofen as directed for pain. You can alternate Tylenol and Ibuprofen every 4 hours. If you take Tylenol at 1pm, then you can take Ibuprofen at 5pm. Then you can take Tylenol again at 9pm.   Make sure to soak the hand in warm soaks for about 10-15 minutes three times a day.   You can apply antibiotic ointment as directed.   Return to the Emergency Dept for any worsening pain, redness or swelling that begins to stpead

## 2019-01-16 NOTE — ED Triage Notes (Signed)
Pt reports redness, swelling and pain around nail of left middle finger x3 days, unknown if she had injury or trauma. Denies any drainage.

## 2019-07-27 ENCOUNTER — Ambulatory Visit: Payer: Medicaid Other | Attending: Internal Medicine

## 2019-07-27 DIAGNOSIS — Z23 Encounter for immunization: Secondary | ICD-10-CM

## 2019-07-27 NOTE — Progress Notes (Signed)
   Covid-19 Vaccination Clinic  Name:  [name]    MRN: [id number] DOB: [birth year]  07/27/2019  [name] was observed post Covid-19 immunization for 15 minutes without incident. She was provided with Vaccine Information Sheet and instruction to access the V-Safe system.   [name] was instructed to call 911 with any severe reactions post vaccine: Marland Kitchen Difficulty breathing  . Swelling of face and throat  . A fast heartbeat  . A bad rash all over body  . Dizziness and weakness   Immunizations Administered    Name Date Dose VIS Date Route   Pfizer COVID-19 Vaccine 07/27/2019 10:30 AM 0.3 mL 05/03/2018 Intramuscular   Manufacturer: [employer], [employer]   Lot: [id number]   NDC: [address]-0

## 2019-08-21 ENCOUNTER — Ambulatory Visit: Payer: Medicaid Other

## 2019-08-26 ENCOUNTER — Ambulatory Visit: Payer: Medicaid Other | Attending: Internal Medicine

## 2019-08-26 DIAGNOSIS — Z23 Encounter for immunization: Secondary | ICD-10-CM

## 2019-08-26 NOTE — Progress Notes (Signed)
   Covid-19 Vaccination Clinic  Name:  MARVALENE BARRETT    MRN: [id number] DOB: 30-Dec-1990  08/26/2019  Ms. Messmer was observed post Covid-19 immunization for 15 minutes without incident. She was provided with Vaccine Information Sheet and instruction to access the V-Safe system.   Ms. Qualls was instructed to call 911 with any severe reactions post vaccine: Marland Kitchen Difficulty breathing  . Swelling of face and throat  . A fast heartbeat  . A bad rash all over body  . Dizziness and weakness   Immunizations Administered    Name Date Dose VIS Date Route   Pfizer COVID-19 Vaccine 08/26/2019  8:11 AM 0.3 mL 05/03/2018 Intramuscular   Manufacturer: [employer], [employer]   Lot: [id number]   NDC: [address]-0

## 2020-03-09 NOTE — L&D Delivery Note (Addendum)
OB/GYN Faculty Practice Delivery Note  [name] is a 30 y.o. P7T0626 s/p SVD at [redacted]w[redacted]d. She was admitted for eIOL.   ROM: 7h 57m with clear fluid GBS Status:  Positive/-- (08/24 1017) Maximum Maternal Temperature:  Temp (24hrs), Avg:98.5 F (36.9 C), Min:97.7 F (36.5 C), Max:99.7 F (37.6 C)    Labor Progress: Patient arrived at 1 cm dilation and was induced with cytotec, cooks catheter, AROM, Pitocin .   Delivery Date/Time: 11/17/2020 at 1332 Delivery: Called to room and patient was complete and pushing. Head delivered in ROA position. No nuchal cord present. Shoulder and body delivered in usual fashion. Considerable amount of blood noted during this part of the delivery concerning for abruption. Infant with spontaneous cry, placed on mother's abdomen, dried and stimulated. Cord clamped x 2 after 1-minute delay, and cut by FOB. Cord blood drawn. Placenta delivered spontaneously with gentle cord traction. Fundus firm with massage and Pitocin. Labia, perineum, vagina, and cervix inspected with right sulcal laceration.   Placenta: spontaneous, intact, marginal infarctions noted, three vessel cord  Complications: Marginal abruption Lacerations: right sulcal repaired with 3-0 vicryl  EBL: 770 Analgesia: epidural    Infant: APGAR (1 MIN):  8 APGAR (5 MINS): 9  APGAR (10 MINS):    Weight: 3484  Derrel Nip, MD  PGY-3, [facility]  11/17/2020 4:42 PM The above was performed under my direct supervision and guidance.

## 2020-04-11 ENCOUNTER — Inpatient Hospital Stay (HOSPITAL_COMMUNITY)
Admission: AD | Admit: 2020-04-11 | Discharge: 2020-04-11 | Disposition: A | Discharge status: Home / Self Care | Payer: Medicaid Other | Attending: Obstetrics and Gynecology | Admitting: Obstetrics and Gynecology

## 2020-04-11 ENCOUNTER — Other Ambulatory Visit: Payer: Self-pay

## 2020-04-11 DIAGNOSIS — Z711 Person with feared health complaint in whom no diagnosis is made: Secondary | ICD-10-CM

## 2020-04-11 DIAGNOSIS — Z3201 Encounter for pregnancy test, result positive: Secondary | ICD-10-CM | POA: Insufficient documentation

## 2020-04-11 NOTE — MAU Note (Signed)
Pt want to confirm pregnancy (needs paper for insurance). Only complaint is loss of appetite. Denies any pain or vag bleeding or discharge.

## 2020-04-11 NOTE — MAU Provider Note (Signed)
Event Date/Time  First Provider Initiated Contact with Patient 04/11/20 0900     S Ms. NADINA FOMBY is a 29 y.o. [phone] patient who presents to MAU for pregnancy confirmation following two positive home pregnancy tests. She denies all pregnancy-related complaints including abdominal pain, vaginal bleeding, abnormal vaginal discharge, fever.   O BP 123/87   Pulse 89   Temp 98.4 F (36.9 C)   Resp 18   Ht 5\' 5"  (1.651 m)   Wt 70.8 kg   LMP 02/12/2020   BMI 25.96 kg/m    Physical Exam Vitals and nursing note reviewed. Exam conducted with a chaperone present.  Constitutional:      Appearance: Normal appearance. She is not ill-appearing.  Cardiovascular:     Rate and Rhythm: Normal rate.  Pulmonary:     Effort: Pulmonary effort is normal.  Neurological:     Mental Status: She is alert and oriented to person, place, and time.  Psychiatric:        Mood and Affect: Mood normal.        Behavior: Behavior normal.        Thought Content: Thought content normal.        Judgment: Judgment normal.    A Medical screening exam complete   P Discharge from MAU in stable condition Reassured of reliability of HPT results Discussed availability of walk-in pregnancy testing at [facility] offices, given list with addresses Warning signs for worsening condition that would warrant emergency follow-up discussed Patient may return to MAU as needed   [facility], Calvert Cantor 04/11/2020 10:59 AM

## 2020-04-16 ENCOUNTER — Encounter: Payer: Self-pay | Admitting: Obstetrics

## 2020-04-16 ENCOUNTER — Ambulatory Visit (INDEPENDENT_AMBULATORY_CARE_PROVIDER_SITE_OTHER): Payer: Medicaid Other | Admitting: *Deleted

## 2020-04-16 ENCOUNTER — Other Ambulatory Visit: Payer: Self-pay

## 2020-04-16 DIAGNOSIS — N926 Irregular menstruation, unspecified: Secondary | ICD-10-CM | POA: Diagnosis not present

## 2020-04-16 DIAGNOSIS — Z3201 Encounter for pregnancy test, result positive: Secondary | ICD-10-CM

## 2020-04-16 LAB — POCT URINE PREGNANCY: Preg Test, Ur: POSITIVE — AB

## 2020-04-16 NOTE — Progress Notes (Signed)
Patient was assessed and managed by nursing staff during this encounter. I have reviewed the chart and agree with the documentation and plan. I have also made any necessary editorial changes.  Warden Fillers, MD 04/16/2020 10:33 AM

## 2020-04-16 NOTE — Progress Notes (Signed)
[name] presents today for UPT. She has no unusual complaints.  LMP:02/16/2020    OBJECTIVE: Appears well, in no apparent distress.  OB History    Gravida  2   Para  2   Term  2   Preterm  0   AB  0   Living  2     SAB  0   IAB  0   Ectopic  0   Multiple  0   Live Births  2          Home UPT Result:Positive In-Office UPT result:Positive  I have reviewed the patient's allergies and medications.   ASSESSMENT: Positive pregnancy test Gest Age today: 8.4 weeks EDD: 11/22/2020  PLAN Prenatal care to be completed at: [name]

## 2020-05-02 ENCOUNTER — Ambulatory Visit (INDEPENDENT_AMBULATORY_CARE_PROVIDER_SITE_OTHER): Payer: Medicaid Other

## 2020-05-02 ENCOUNTER — Other Ambulatory Visit: Payer: Self-pay

## 2020-05-02 VITALS — BP 136/83 | HR 78 | Ht 65.0 in | Wt 151.3 lb

## 2020-05-02 DIAGNOSIS — Z3402 Encounter for supervision of normal first pregnancy, second trimester: Secondary | ICD-10-CM | POA: Insufficient documentation

## 2020-05-02 DIAGNOSIS — Z789 Other specified health status: Secondary | ICD-10-CM | POA: Diagnosis not present

## 2020-05-02 DIAGNOSIS — Z3491 Encounter for supervision of normal pregnancy, unspecified, first trimester: Secondary | ICD-10-CM | POA: Insufficient documentation

## 2020-05-02 DIAGNOSIS — O3680X0 Pregnancy with inconclusive fetal viability, not applicable or unspecified: Secondary | ICD-10-CM | POA: Diagnosis not present

## 2020-05-02 MED ORDER — BLOOD PRESSURE KIT DEVI: 1.0000 | 0 refills | Status: DC

## 2020-05-02 NOTE — Progress Notes (Signed)
Patient was assessed and managed by nursing staff during this encounter. I have reviewed the chart and agree with the documentation and plan. I have also made any necessary editorial changes.  Gleen Ripberger, MD 05/02/2020 10:54 AM    

## 2020-05-02 NOTE — Progress Notes (Signed)
PRENATAL INTAKE SUMMARY  [name] presents today New OB Nurse Interview.  OB History    Gravida  3   Para  2   Term  2   Preterm  0   AB  0   Living  2     SAB  0   IAB  0   Ectopic  0   Multiple  0   Live Births  2          I have reviewed the patient's medical, obstetrical, social, and family histories, medications, and available lab results.  SUBJECTIVE She has no unusual complaints  OBJECTIVE Initial Physical Exam (New OB)  GENERAL APPEARANCE: alert, well appearing   ASSESSMENT Normal pregnancy  PLAN Prenatal care to be completed at [place] to be completed at [facility] Provider visit Baby Scripts ordered Blood pressure kit ordered PHQ 2 score: 0 GAD 7 score: 0 U/S performed today reveals single live IUP at [id number] [place] FHR 166

## 2020-05-08 ENCOUNTER — Other Ambulatory Visit (HOSPITAL_COMMUNITY)
Admission: RE | Admit: 2020-05-08 | Discharge: 2020-05-08 | Disposition: A | Discharge status: Home / Self Care | Payer: Medicaid Other | Source: Ambulatory Visit | Attending: Women's Health | Admitting: Women's Health

## 2020-05-08 ENCOUNTER — Ambulatory Visit (INDEPENDENT_AMBULATORY_CARE_PROVIDER_SITE_OTHER): Payer: Medicaid Other | Admitting: Women's Health

## 2020-05-08 ENCOUNTER — Other Ambulatory Visit: Payer: Self-pay

## 2020-05-08 VITALS — BP 114/75 | HR 76 | Wt 155.4 lb

## 2020-05-08 DIAGNOSIS — Z3A11 11 weeks gestation of pregnancy: Secondary | ICD-10-CM | POA: Diagnosis not present

## 2020-05-08 DIAGNOSIS — Z3480 Encounter for supervision of other normal pregnancy, unspecified trimester: Secondary | ICD-10-CM | POA: Diagnosis not present

## 2020-05-08 DIAGNOSIS — Z3481 Encounter for supervision of other normal pregnancy, first trimester: Secondary | ICD-10-CM | POA: Diagnosis not present

## 2020-05-08 NOTE — Patient Instructions (Addendum)
Maternity Assessment Unit (MAU)  The Maternity Assessment Unit (MAU) is located at the [facility] at [facility]. The address is: [address] [place], [place], [place] [address]. Please see map below for additional directions.    The Maternity Assessment Unit is designed to help you during your pregnancy, and for up to 6 weeks after delivery, with any pregnancy- or postpartum-related emergencies, if you think you are in labor, or if your water has broken. For example, if you experience nausea and vomiting, vaginal bleeding, severe abdominal or pelvic pain, elevated blood pressure or other problems related to your pregnancy or postpartum time, please come to the Maternity Assessment Unit for assistance.        Obstetrics: Normal and Problem Pregnancies (7th ed., pp. 102-121). [place], [place]: Elsevier."> Textbook of Family Medicine (9th ed., pp. [phone]). [place], [place]: [employer].">  First Trimester of Pregnancy  The first trimester of pregnancy starts on the first day of your last menstrual period until the end of week 12. This is months 1 through 3 of pregnancy. A week after a sperm fertilizes an egg, the egg will implant into the wall of the uterus and begin to develop into a baby. By the end of 12 weeks, all the baby's organs will be formed and the baby will be 2-3 inches in size. Body changes during your first trimester Your body goes through many changes during pregnancy. The changes vary and generally return to normal after your baby is born. Physical changes  You may gain or lose weight.  Your breasts may begin to grow larger and become tender. The tissue that surrounds your nipples (areola) may become darker.  Dark spots or blotches (chloasma or mask of pregnancy) may develop on your face.  You may have changes in your hair. These can include thickening or thinning of your hair or changes in texture. Health changes  You may feel  nauseous, and you may vomit.  You may have heartburn.  You may develop headaches.  You may develop constipation.  Your gums may bleed and may be sensitive to brushing and flossing. Other changes  You may tire easily.  You may urinate more often.  Your menstrual periods will stop.  You may have a loss of appetite.  You may develop cravings for certain kinds of food.  You may have changes in your emotions from day to day.  You may have more vivid and strange dreams. Follow these instructions at home: Medicines  Follow your health care provider's instructions regarding medicine use. Specific medicines may be either safe or unsafe to take during pregnancy. Do not take any medicines unless told to by your health care provider.  Take a prenatal vitamin that contains at least 600 micrograms (mcg) of folic acid. Eating and drinking  Eat a healthy diet that includes fresh fruits and vegetables, whole grains, good sources of protein such as meat, eggs, or tofu, and low-fat dairy products.  Avoid raw meat and unpasteurized juice, milk, and cheese. These carry germs that can harm you and your baby.  If you feel nauseous or you vomit: ? Eat 4 or 5 small meals a day instead of 3 large meals. ? Try eating a few soda crackers. ? Drink liquids between meals instead of during meals.  You may need to take these actions to prevent or treat constipation: ? Drink enough fluid to keep your urine pale yellow. ? Eat foods that are high in fiber, such  as beans, whole grains, and fresh fruits and vegetables. ? Limit foods that are high in fat and processed sugars, such as fried or sweet foods. Activity  Exercise only as directed by your health care provider. Most people can continue their usual exercise routine during pregnancy. Try to exercise for 30 minutes at least 5 days a week.  Stop exercising if you develop pain or cramping in the lower abdomen or lower back.  Avoid exercising if it is  very hot or humid or if you are at high altitude.  Avoid heavy lifting.  If you choose to, you may have sex unless your health care provider tells you not to. Relieving pain and discomfort  Wear a good support bra to relieve breast tenderness.  Rest with your legs elevated if you have leg cramps or low back pain.  If you develop bulging veins (varicose veins) in your legs: ? Wear support hose as told by your health care provider. ? Elevate your feet for 15 minutes, 3-4 times a day. ? Limit salt in your diet. Safety  Wear your seat belt at all times when driving or riding in a car.  Talk with your health care provider if someone is verbally or physically abusive to you.  Talk with your health care provider if you are feeling sad or have thoughts of hurting yourself. Lifestyle  Do not use hot tubs, steam rooms, or saunas.  Do not douche. Do not use tampons or scented sanitary pads.  Do not use herbal remedies, alcohol, illegal drugs, or medicines that are not approved by your health care provider. Chemicals in these products can harm your baby.  Do not use any products that contain nicotine or tobacco, such as cigarettes, e-cigarettes, and chewing tobacco. If you need help quitting, ask your health care provider.  Avoid cat litter boxes and soil used by cats. These carry germs that can cause birth defects in the baby and possibly loss of the unborn baby (fetus) by miscarriage or stillbirth. General instructions  During routine prenatal visits in the first trimester, your health care provider will do a physical exam, perform necessary tests, and ask you how things are going. Keep all follow-up visits. This is important.  Ask for help if you have counseling or nutritional needs during pregnancy. Your health care provider can offer advice or refer you to specialists for help with various needs.  Schedule a dentist appointment. At home, brush your teeth with a soft toothbrush. Floss  gently.  Write down your questions. Take them to your prenatal visits. Where to find more information  American Pregnancy Association: americanpregnancy.org  [employer]: https://www.todd-brady.net/  Office on [employer] Health: [employer].nz Contact a health care provider if you have:  Dizziness.  A fever.  Mild pelvic cramps, pelvic pressure, or nagging pain in the abdominal area.  Nausea, vomiting, or diarrhea that lasts for 24 hours or longer.  A bad-smelling vaginal discharge.  Pain when you urinate.  Known exposure to a contagious illness, such as chickenpox, measles, Zika virus, HIV, or hepatitis. Get help right away if you have:  Spotting or bleeding from your vagina.  Severe abdominal cramping or pain.  Shortness of breath or chest pain.  Any kind of trauma, such as from a fall or a car crash.  New or increased pain, swelling, or redness in an arm or leg. Summary  The first trimester of pregnancy starts on the first day of your last menstrual period until  the end of week 12 (months 1 through 3).  Eating 4 or 5 small meals a day rather than 3 large meals may help to relieve nausea and vomiting.  Do not use any products that contain nicotine or tobacco, such as cigarettes, e-cigarettes, and chewing tobacco. If you need help quitting, ask your health care provider.  Keep all follow-up visits. This is important. This information is not intended to replace advice given to you by your health care provider. Make sure you discuss any questions you have with your health care provider. Document Revised: 08/02/2019 Document Reviewed: 06/08/2019 Elsevier Patient Education  2021 Elsevier Inc.        Second Trimester of Pregnancy  The second trimester of pregnancy is from week 13 through week 27. This is months 4 through 6 of pregnancy. The second trimester is often a time when you feel your best. Your  body has adjusted to being pregnant, and you begin to feel better physically. During the second trimester:  Morning sickness has lessened or stopped completely.  You may have more energy.  You may have an increase in appetite. The second trimester is also a time when the unborn baby (fetus) is growing rapidly. At the end of the sixth month, the fetus may be up to 12 inches long and weigh about 1 pounds. You will likely begin to feel the baby move (quickening) between 16 and 20 weeks of pregnancy. Body changes during your second trimester Your body continues to go through many changes during your second trimester. The changes vary and generally return to normal after the baby is born. Physical changes  Your weight will continue to increase. You will notice your lower abdomen bulging out.  You may begin to get stretch marks on your hips, abdomen, and breasts.  Your breasts will continue to grow and to become tender.  Dark spots or blotches (chloasma or mask of pregnancy) may develop on your face.  A dark line from your belly button to the pubic area (linea nigra) may appear.  You may have changes in your hair. These can include thickening of your hair, rapid growth, and changes in texture. Some people also have hair loss during or after pregnancy, or hair that feels dry or thin. Health changes  You may develop headaches.  You may have heartburn.  You may develop constipation.  You may develop hemorrhoids or swollen, bulging veins (varicose veins).  Your gums may bleed and may be sensitive to brushing and flossing.  You may urinate more often because the fetus is pressing on your bladder.  You may have back pain. This is caused by: ? Weight gain. ? Pregnancy hormones that are relaxing the joints in your pelvis. ? A shift in weight and the muscles that support your balance. Follow these instructions at home: Medicines  Follow your health care provider's instructions  regarding medicine use. Specific medicines may be either safe or unsafe to take during pregnancy. Do not take any medicines unless approved by your health care provider.  Take a prenatal vitamin that contains at least 600 micrograms (mcg) of folic acid. Eating and drinking  Eat a healthy diet that includes fresh fruits and vegetables, whole grains, good sources of protein such as meat, eggs, or tofu, and low-fat dairy products.  Avoid raw meat and unpasteurized juice, milk, and cheese. These carry germs that can harm you and your baby.  You may need to take these actions to prevent or treat constipation: ? Drink  enough fluid to keep your urine pale yellow. ? Eat foods that are high in fiber, such as beans, whole grains, and fresh fruits and vegetables. ? Limit foods that are high in fat and processed sugars, such as fried or sweet foods. Activity  Exercise only as directed by your health care provider. Most people can continue their usual exercise routine during pregnancy. Try to exercise for 30 minutes at least 5 days a week. Stop exercising if you develop contractions in your uterus.  Stop exercising if you develop pain or cramping in the lower abdomen or lower back.  Avoid exercising if it is very hot or humid or if you are at a high altitude.  Avoid heavy lifting.  If you choose to, you may have sex unless your health care provider tells you not to. Relieving pain and discomfort  Wear a supportive bra to prevent discomfort from breast tenderness.  Take warm sitz baths to soothe any pain or discomfort caused by hemorrhoids. Use hemorrhoid cream if your health care provider approves.  Rest with your legs raised (elevated) if you have leg cramps or low back pain.  If you develop varicose veins: ? Wear support hose as told by your health care provider. ? Elevate your feet for 15 minutes, 3-4 times a day. ? Limit salt in your diet. Safety  Wear your seat belt at all times when  driving or riding in a car.  Talk with your health care provider if someone is verbally or physically abusive to you. Lifestyle  Do not use hot tubs, steam rooms, or saunas.  Do not douche. Do not use tampons or scented sanitary pads.  Avoid cat litter boxes and soil used by cats. These carry germs that can cause birth defects in the baby and possibly loss of the fetus by miscarriage or stillbirth.  Do not use herbal remedies, alcohol, illegal drugs, or medicines that are not approved by your health care provider. Chemicals in these products can harm your baby.  Do not use any products that contain nicotine or tobacco, such as cigarettes, e-cigarettes, and chewing tobacco. If you need help quitting, ask your health care provider. General instructions  During a routine prenatal visit, your health care provider will do a physical exam and other tests. He or she will also discuss your overall health. Keep all follow-up visits. This is important.  Ask your health care provider for a referral to a local prenatal education class.  Ask for help if you have counseling or nutritional needs during pregnancy. Your health care provider can offer advice or refer you to specialists for help with various needs. Where to find more information  American Pregnancy Association: americanpregnancy.org  [employer]: https://www.todd-brady.net/  Office on [employer] Health: [employer].nz Contact a health care provider if you have:  A headache that does not go away when you take medicine.  Vision changes or you see spots in front of your eyes.  Mild pelvic cramps, pelvic pressure, or nagging pain in the abdominal area.  Persistent nausea, vomiting, or diarrhea.  A bad-smelling vaginal discharge or foul-smelling urine.  Pain when you urinate.  Sudden or extreme swelling of your face, hands, ankles, feet, or legs.  A fever. Get help right  away if you:  Have fluid leaking from your vagina.  Have spotting or bleeding from your vagina.  Have severe abdominal cramping or pain.  Have difficulty breathing.  Have chest pain.  Have fainting spells.  Have  not felt your baby move for the time period told by your health care provider.  Have new or increased pain, swelling, or redness in an arm or leg. Summary  The second trimester of pregnancy is from week 13 through week 27 (months 4 through 6).  Do not use herbal remedies, alcohol, illegal drugs, or medicines that are not approved by your health care provider. Chemicals in these products can harm your baby.  Exercise only as directed by your health care provider. Most people can continue their usual exercise routine during pregnancy.  Keep all follow-up visits. This is important. This information is not intended to replace advice given to you by your health care provider. Make sure you discuss any questions you have with your health care provider. Document Revised: 08/02/2019 Document Reviewed: 06/08/2019 Elsevier Patient Education  2021 Elsevier Inc.                        Safe Medications in Pregnancy    Acne: Benzoyl Peroxide Salicylic Acid  Backache/Headache: Tylenol: 2 regular strength every 4 hours OR              2 Extra strength every 6 hours  Colds/Coughs/Allergies: Benadryl (alcohol free) 25 mg every 6 hours as needed Breath right strips Claritin Cepacol throat lozenges Chloraseptic throat spray Cold-Eeze- up to three times per day Cough drops, alcohol free Flonase (by prescription only) Guaifenesin Mucinex Robitussin DM (plain only, alcohol free) Saline nasal spray/drops Sudafed (pseudoephedrine) & Actifed ** use only after [redacted] weeks gestation and if you do not have high blood pressure Tylenol Vicks Vaporub Zinc lozenges Zyrtec   Constipation: Colace Ducolax suppositories Fleet enema Glycerin suppositories Metamucil Milk of  magnesia Miralax Senokot Smooth move tea  Diarrhea: Kaopectate Imodium A-D  *NO pepto Bismol  Hemorrhoids: Anusol Anusol HC Preparation H Tucks  Indigestion: Tums Maalox Mylanta Zantac  Pepcid  Insomnia: Benadryl (alcohol free) 25mg  every 6 hours as needed Tylenol PM Unisom, no Gelcaps  Leg Cramps: Tums MagGel  Nausea/Vomiting:  Bonine Dramamine Emetrol Ginger extract Sea bands Meclizine  Nausea medication to take during pregnancy:  Unisom (doxylamine succinate 25 mg tablets) Take one tablet daily at bedtime. If symptoms are not adequately controlled, the dose can be increased to a maximum recommended dose of two tablets daily (1/2 tablet in the morning, 1/2 tablet mid-afternoon and one at bedtime). Vitamin B6 100mg  tablets. Take one tablet twice a day (up to 200 mg per day).  Skin Rashes: Aveeno products Benadryl cream or 25mg  every 6 hours as needed Calamine Lotion 1% cortisone cream  Yeast infection: Gyne-lotrimin 7 Monistat 7   **If taking multiple medications, please check labels to avoid duplicating the same active ingredients **take medication as directed on the label ** Do not exceed 4000 mg of tylenol in 24 hours **Do not take medications that contain aspirin or ibuprofen           Alpha-Fetoprotein Test Why am I having this test? The alpha-fetoprotein test is a lab test most commonly used for pregnant women to help screen for birth defects in their unborn baby. It can be used to screen for chromosome (DNA) abnormalities, problems with the brain or spinal cord, or problems with the abdominal wall of the unborn baby (fetus). The alpha-fetoprotein test may also be done for men or nonpregnant women to check for certain cancers. What is being tested? This test measures the amount of alpha-fetoprotein (AFP) in your blood. AFP is  a protein that is made by the liver. Levels can be detected in the mother's blood during pregnancy, starting at  10 weeks and peaking at 16-18 weeks of the pregnancy. Abnormal levels can sometimes be a sign of a birth defect in the baby. Certain cancers can cause a high level of AFP in men and nonpregnant women. What kind of sample is taken? A blood sample is required for this test. It is usually collected by inserting a needle into a blood vessel.   How are the results reported? Your test results will be reported as values. Your health care provider will compare your results to normal ranges that were established after testing a large group of people (reference values). Reference values may vary among labs and hospitals. For this test, common reference values are:  Adult: Less than 40 ng/mL or less than 40 mcg/L (SI units).  Child younger than 1 year: Less than 30 ng/mL. If you are pregnant, the values may also vary based on how long you have been pregnant. What do the results mean? Results that are above the reference values in pregnant women may indicate the following for the baby:  Neural tube defects, such as abnormalities of the spinal cord or brain.  Abdominal wall defects.  Multiple pregnancy such as twins.  Fetal distress or fetal death. Results that are above the reference values in men or nonpregnant women may indicate:  Reproductive cancers, such as ovarian or testicular cancer.  Liver cancer.  Liver cell death.  Other types of cancer. Very low levels of AFP in pregnant women may indicate Down syndrome for the baby. Talk with your health care provider about what your results mean. Questions to ask your health care provider Ask your health care provider, or the department that is doing the test:  When will my results be ready?  How will I get my results?  What are my treatment options?  What other tests do I need?  What are my next steps? Summary  The alpha-fetoprotein test is done on pregnant women to help screen for birth defects in their unborn baby.  Certain  cancers can cause a high level of AFP in men and nonpregnant women.  For this test, a blood sample is usually collected by inserting a needle into a blood vessel.  Talk with your health care provider about what your results mean. This information is not intended to replace advice given to you by your health care provider. Make sure you discuss any questions you have with your health care provider. Document Revised: 09/15/2019 Document Reviewed: 09/15/2019 Elsevier Patient Education  2021 [employer].

## 2020-05-08 NOTE — Progress Notes (Signed)
History:   [name] is a 30 y.o. G3P2002 at 16w5dby early ultrasound being seen today for her first obstetrical visit.  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 Patient does intend to breast feed. Pregnancy history fully reviewed.  Pt reports this is a desired and planned pregnancy. Allergies: bananas, oranges, NKDA Current Medications: PNVs PMH: No HTN, DM, asthma. PSH: wisdom teeth removed OB Hx: 2017 (full-term, NSVD, no pregnancy problems), 2020 (full-term, NSVD, no pregnancy problems) Social Hx pt does not smoke, drink, or use drugs. Family Hx: none Pt accepts flu vaccine.  Patient reports no complaints.      HISTORY: OB History  Gravida Para Term Preterm AB Living  '3 2 2 ' 0 0 2  SAB IAB Ectopic Multiple Live Births  0 0 0 0 2    # Outcome Date GA Lbr Len/2nd Weight Sex Delivery Anes PTL Lv  3 Current           2 Term 11/08/18 [id number] 7 lb 4.9 oz (3.314 kg) F Vag-Spont EPI  LIV     Birth Comments: wnl     Name: Betley,GIRL Roberta     Apgar1: 8  Apgar5: 9  1 Term 05/29/15 4067w5d:08 / 02:56 7 lb 0.7 oz (3.195 kg) M Vag-Spont EPI  LIV     Name: Faulkner,BOY Mayley     Apgar1: 8  Apgar5: 9    Last pap smear was done 12/2017 and was normal  Past Medical History:  Diagnosis Date  . Allergy   . Asthma   . Vaginal Pap smear, abnormal    +HPV, LSIL   Past Surgical History:  Procedure Laterality Date  . WISDOM TOOTH EXTRACTION Bilateral 2010   Family History  Problem Relation Age of Onset  . COPD Mother   . Hypertension Father   . Seizures Brother   . Diabetes Paternal Grandmother   . Hypertension Paternal Grandmother   . Diabetes Paternal Grandfather   . Hypertension Paternal Grandfather   . Diabetes Paternal Aunt   . Diabetes Paternal Uncle   . Cancer Neg Hx    Social History   Tobacco Use  . Smoking status: Never Smoker  . Smokeless tobacco: Never Used  Vaping Use  . Vaping Use:  Never used  Substance Use Topics  . Alcohol use: No  . Drug use: No   Allergies  Allergen Reactions  . Banana Itching and Swelling  . Orange Fruit [Citrus] Itching and Swelling   Current Outpatient Medications on File Prior to Visit  Medication Sig Dispense Refill  . Prenatal Vit-Fe Fumarate-FA (MULTIVITAMIN-PRENATAL) 27-0.8 MG TABS tablet Take 1 tablet by mouth daily at 12 noon.    . aMarland Kitchenbuterol (PROVENTIL HFA;VENTOLIN HFA) 108 (90 Base) MCG/ACT inhaler Inhale 1-2 puffs into the lungs every 6 (six) hours as needed for wheezing or shortness of breath. (Patient not taking: Reported on 05/08/2020) 1 Inhaler 0  . Blood Pressure Monitoring (BLOOD PRESSURE KIT) DEVI 1 kit by Does not apply route once a week. (Patient not taking: Reported on 05/08/2020) 1 each 0   No current facility-administered medications on file prior to visit.    Review of Systems Pertinent items noted in HPI and remainder of comprehensive ROS otherwise negative.  Physical Exam:   Vitals:   05/08/20 0908  BP: 114/75  Pulse: 76  Weight: 155 lb 6.4 oz (70.5 kg)   Fetal Heart Rate (bpm): 156  General: well-developed, well-nourished female  in no acute distress  Breasts:  normal appearance, no masses or tenderness bilaterally  Skin: normal coloration and turgor, no rashes  Neurologic: oriented, normal, negative, normal mood  Extremities: normal strength, tone, and muscle mass, ROM of all joints is normal  HEENT PERRLA, extraocular movement intact and sclera clear, anicteric  Neck supple and no masses  Cardiovascular: regular rate and rhythm  Respiratory:  no respiratory distress, normal breath sounds  Abdomen: soft, non-tender; bowel sounds normal; no masses,  no organomegaly  Pelvic: normal external genitalia, no lesions, Pap not indicated, remainder of exam deferred:      Assessment:    Pregnancy: [id number] Patient Active Problem List   Diagnosis Date Noted  . Encounter for supervision of normal pregnancy in  first trimester 05/02/2020  . History of third degree perineal laceration, no complications 16/38/4665  . 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 Supervision of other normal pregnancy, antepartum - Cervicovaginal ancillary only( [place]) - CBC/D/Plt+RPR+Rh+ABO+Rub Ab... - Culture, OB Urine - Genetic Screening  Initial labs drawn. Continue prenatal vitamins. Problem list reviewed and updated. Genetic Screening discussed, NIPS: ordered. Ultrasound discussed; fetal anatomic survey: ordered. Anticipatory guidance about prenatal visits given including labs, ultrasounds, and testing. Discussed usage of Babyscripts and virtual visits as additional source of managing and completing prenatal visits in midst of coronavirus and pandemic.   Encouraged to complete MyChart Registration for her ability to review results, send requests, and have questions addressed.  The nature of [facility] with multiple MDs and Advanced Practice Providers was explained to patient; also emphasized that residents, students are part of our team. Routine obstetric precautions reviewed. Encouraged to seek out care at office or emergency room [facility] MAU preferred) for urgent and/or emergent concerns. Return in about 4 weeks (around 06/05/2020) for in-person ROB/APP OK/AFP, needs anatomy US scheduled.     Clarisa Fling, NP  9:45 AM 05/08/2020

## 2020-05-08 NOTE — Progress Notes (Signed)
NOB in office for provider visit. Intake interview and U/S completed on 05-02-20. Pt denies any complications today.

## 2020-05-09 LAB — CBC/D/PLT+RPR+RH+ABO+RUB AB...
Antibody Screen: NEGATIVE
Basophils Absolute: 0 10*3/uL (ref 0.0–0.2)
Basos: 0 %
EOS (ABSOLUTE): 0 10*3/uL (ref 0.0–0.4)
Eos: 0 %
HCV Ab: <0.1 s/co ratio (ref 0.0–0.9)
HIV Screen 4th Generation wRfx: NONREACTIVE
Hematocrit: 38.2 % (ref 34.0–46.6)
Hemoglobin: 12.5 g/dL (ref 11.1–15.9)
Hepatitis B Surface Ag: NEGATIVE
Immature Grans (Abs): 0 10*3/uL (ref 0.0–0.1)
Immature Granulocytes: 0 %
Lymphocytes Absolute: 1.7 10*3/uL (ref 0.7–3.1)
Lymphs: 19 %
MCH: 30.9 pg (ref 26.6–33.0)
MCHC: 32.7 g/dL (ref 31.5–35.7)
MCV: 94 fL (ref 79–97)
Monocytes Absolute: 0.6 10*3/uL (ref 0.1–0.9)
Monocytes: 7 %
Neutrophils Absolute: 6.8 10*3/uL (ref 1.4–7.0)
Neutrophils: 74 %
Platelets: 290 10*3/uL (ref 150–450)
RBC: 4.05 x10E6/uL (ref 3.77–5.28)
RDW: 13.4 % (ref 11.7–15.4)
RPR Ser Ql: NONREACTIVE
Rh Factor: POSITIVE
Rubella Antibodies, IGG: 3.35 index (ref >0.99)
WBC: 9.3 10*3/uL (ref 3.4–10.8)

## 2020-05-09 LAB — CERVICOVAGINAL ANCILLARY ONLY
Chlamydia: NEGATIVE
Comment: NEGATIVE
Comment: NEGATIVE
Comment: NORMAL
Neisseria Gonorrhea: NEGATIVE
Trichomonas: NEGATIVE

## 2020-05-09 LAB — HCV INTERPRETATION

## 2020-05-10 LAB — CULTURE, OB URINE

## 2020-05-10 LAB — URINE CULTURE, OB REFLEX

## 2020-05-16 ENCOUNTER — Encounter: Payer: Self-pay | Admitting: Women's Health

## 2020-05-30 ENCOUNTER — Encounter: Payer: Self-pay | Admitting: Women's Health

## 2020-06-05 ENCOUNTER — Encounter: Payer: Self-pay | Admitting: Women's Health

## 2020-06-05 ENCOUNTER — Telehealth (INDEPENDENT_AMBULATORY_CARE_PROVIDER_SITE_OTHER): Payer: Medicaid Other | Admitting: Women's Health

## 2020-06-05 DIAGNOSIS — K5900 Constipation, unspecified: Secondary | ICD-10-CM | POA: Insufficient documentation

## 2020-06-05 DIAGNOSIS — Z3A15 15 weeks gestation of pregnancy: Secondary | ICD-10-CM

## 2020-06-05 DIAGNOSIS — Z3481 Encounter for supervision of other normal pregnancy, first trimester: Secondary | ICD-10-CM

## 2020-06-05 DIAGNOSIS — O219 Vomiting of pregnancy, unspecified: Secondary | ICD-10-CM | POA: Insufficient documentation

## 2020-06-05 DIAGNOSIS — O99612 Diseases of the digestive system complicating pregnancy, second trimester: Secondary | ICD-10-CM

## 2020-06-05 MED ORDER — DOCUSATE SODIUM 100 MG PO CAPS: 100.0000 mg | ORAL_CAPSULE | Freq: Two times a day (BID) | ORAL | 2 refills | Status: DC

## 2020-06-05 NOTE — Patient Instructions (Addendum)
Maternity Assessment Unit (MAU)  The Maternity Assessment Unit (MAU) is located at the [facility]. The address is: [address] [place], [place], [place] [address]. Please see map below for additional directions.    The Maternity Assessment Unit is designed to help you during your pregnancy, and for up to 6 weeks after delivery, with any pregnancy- or postpartum-related emergencies, if you think you are in labor, or if your water has broken. For example, if you experience nausea and vomiting, vaginal bleeding, severe abdominal or pelvic pain, elevated blood pressure or other problems related to your pregnancy or postpartum time, please come to the Maternity Assessment Unit for assistance.                        Safe Medications in Pregnancy    Acne: Benzoyl Peroxide Salicylic Acid  Backache/Headache: Tylenol: 2 regular strength every 4 hours OR              2 Extra strength every 6 hours  Colds/Coughs/Allergies: Benadryl (alcohol free) 25 mg every 6 hours as needed Breath right strips Claritin Cepacol throat lozenges Chloraseptic throat spray Cold-Eeze- up to three times per day Cough drops, alcohol free Flonase (by prescription only) Guaifenesin Mucinex Robitussin DM (plain only, alcohol free) Saline nasal spray/drops Sudafed (pseudoephedrine) & Actifed ** use only after [redacted] weeks gestation and if you do not have high blood pressure Tylenol Vicks Vaporub Zinc lozenges Zyrtec   Constipation: Colace - long-term Ducolax suppositories - immediate relief Fleet enema - immediate relief Glycerin suppositories Metamucil Milk of magnesia - immediate relif Miralax Senokot Smooth move tea  Diarrhea: Kaopectate Imodium A-D  *NO pepto Bismol  Hemorrhoids: Anusol Anusol HC Preparation H Tucks  Indigestion: Tums Maalox Mylanta Zantac  Pepcid  Insomnia: Benadryl (alcohol free) 25mg  every 6 hours as  needed Tylenol PM Unisom, no Gelcaps  Leg Cramps: Tums MagGel  Nausea/Vomiting:  Bonine Dramamine Emetrol Ginger extract Sea bands Meclizine  Nausea medication to take during pregnancy:  Unisom (doxylamine succinate 25 mg tablets) Take one tablet daily at bedtime. If symptoms are not adequately controlled, the dose can be increased to a maximum recommended dose of two tablets daily (1/2 tablet in the morning, 1/2 tablet mid-afternoon and one at bedtime). Vitamin B6 100mg  tablets. Take one tablet twice a day (up to 200 mg per day).  Skin Rashes: Aveeno products Benadryl cream or 25mg  every 6 hours as needed Calamine Lotion 1% cortisone cream  Yeast infection: Gyne-lotrimin 7 Monistat 7   **If taking multiple medications, please check labels to avoid duplicating the same active ingredients **take medication as directed on the label ** Do not exceed 4000 mg of tylenol in 24 hours **Do not take medications that contain aspirin or ibuprofen          You have constipation which is hard stools that are difficult to pass. It is important to have regular bowel movements every 1-3 days that are soft and easy to pass. Hard stools increase your risk of hemorrhoids and are very uncomfortable.   To prevent constipation you can increase the amount of fiber in your diet. Examples of foods with fiber are leafy greens, whole grain breads, oatmeal and other grains.  It is also important to drink at least eight 8oz glass of water everyday.   If you have not has a bowel movement in 4-5 days you made need to clean out your bowel.  This will have establish normal movement through your bowel.    Miralax Clean out  Take 8 capfuls of miralax in 64 oz of gatorade. You can use any fluid that appeals to you (gatorade, water, juice)  Continue to drink at least eight 8 oz glasses of water throughout the day  You can repeat with another 8 capfuls of miralax in 64 oz of gatorade if you are not  having a large amount of stools  You will need to be at home and close to a bathroom for about 8 hours when you do the above as you may need to go to the bathroom frequently.   After you are cleaned out: - Start Colace100mg  twice daily - Start Miralax once daily - Start a daily fiber supplement like metamucil or citrucel - You can safely use enemas in pregnancy  - if you are having diarrhea you can reduce to Colace once a day or miralax every other day or a 1/2 capful daily.         Constipation, Adult Constipation is when a person has fewer than three bowel movements in a week, has difficulty having a bowel movement, or has stools (feces) that are dry, hard, or larger than normal. Constipation may be caused by an underlying condition. It may become worse with age if a person takes certain medicines and does not take in enough fluids. Follow these instructions at home: Eating and drinking  Eat foods that have a lot of fiber, such as beans, whole grains, and fresh fruits and vegetables.  Limit foods that are low in fiber and high in fat and processed sugars, such as fried or sweet foods. These include french fries, hamburgers, cookies, candies, and soda.  Drink enough fluid to keep your urine pale yellow.   General instructions  Exercise regularly or as told by your health care provider. Try to do 150 minutes of moderate exercise each week.  Use the bathroom when you have the urge to go. Do not hold it in.  Take over-the-counter and prescription medicines only as told by your health care provider. This includes any fiber supplements.  During bowel movements: ? Practice deep breathing while relaxing the lower abdomen. ? Practice pelvic floor relaxation.  Watch your condition for any changes. Let your health care provider know about them.  Keep all follow-up visits as told by your health care provider. This is important. Contact a health care provider if:  You have pain that  gets worse.  You have a fever.  You do not have a bowel movement after 4 days.  You vomit.  You are not hungry or you lose weight.  You are bleeding from the opening between the buttocks (anus).  You have thin, pencil-like stools. Get help right away if:  You have a fever and your symptoms suddenly get worse.  You leak stool or have blood in your stool.  Your abdomen is bloated.  You have severe pain in your abdomen.  You feel dizzy or you faint. Summary  Constipation is when a person has fewer than three bowel movements in a week, has difficulty having a bowel movement, or has stools (feces) that are dry, hard, or larger than normal.  Eat foods that have a lot of fiber, such as beans, whole grains, and fresh fruits and vegetables.  Drink enough fluid to keep your urine pale yellow.  Take over-the-counter and prescription medicines only as told by your health care provider. This includes any  fiber supplements. This information is not intended to replace advice given to you by your health care provider. Make sure you discuss any questions you have with your health care provider. Document Revised: 01/11/2019 Document Reviewed: 01/11/2019 Elsevier Patient Education  2021 Elsevier Inc.        Preterm Labor The normal length of a pregnancy is 39-41 weeks. Preterm labor is when labor starts before 37 completed weeks of pregnancy. Babies who are born prematurely and survive may not be fully developed and may be at an increased risk for long-term problems such as cerebral palsy, developmental delays, and vision and hearing problems. Babies who are born too early may have problems soon after birth. Problems may include regulating blood sugar, body temperature, heart rate, and breathing rate. These babies often have trouble with feeding. The risk of having problems is highest for babies who are born before 34 weeks of pregnancy. What are the causes? The exact cause of this  condition is not known. What increases the risk? You are more likely to have preterm labor if you have certain risk factors that relate to your medical history, problems with present and past pregnancies, and lifestyle factors. Medical history  You have abnormalities of the uterus, including a short cervix.  You have STIs (sexually transmitted infections), or other infections of the urinary tract and the vagina.  You have chronic illnesses, such as blood clotting problems, diabetes, or high blood pressure.  You are overweight or underweight. Present and past pregnancies  You have had preterm labor before.  You are pregnant with twins or other multiples.  You have been diagnosed with a condition in which the placenta covers your cervix (placenta previa).  You waited less than 6 months between giving birth and becoming pregnant again.  Your unborn baby has some abnormalities.  You have vaginal bleeding during pregnancy.  You became pregnant through in vitro fertilization (IVF). Lifestyle and environmental factors  You use tobacco products.  You drink alcohol.  You use street drugs.  You have stress and no social support.  You experience domestic violence.  You are exposed to certain chemicals or environmental pollutants. Other factors  You are younger than age 23 or older than age 5. What are the signs or symptoms? Symptoms of this condition include:  Cramps similar to those that can happen during a menstrual period. The cramps may happen with diarrhea.  Pain in the abdomen or lower back.  Regular contractions that may feel like tightening of the abdomen.  A feeling of increased pressure in the pelvis.  Increased watery or bloody mucus discharge from the vagina.  Water breaking (ruptured amniotic sac). How is this diagnosed? This condition is diagnosed based on:  Your medical history and a physical exam.  A pelvic exam.  An ultrasound.  Monitoring your  uterus for contractions.  Other tests, including: ? A swab of the cervix to check for a chemical called fetal fibronectin. ? Urine tests. How is this treated? Treatment for this condition depends on the length of your pregnancy, your condition, and the health of your baby. Treatment may include:  Taking medicines, such as: ? Hormone medicines. These may be given early in pregnancy to help support the pregnancy. ? Medicines to stop contractions. ? Medicines to help mature the baby's lungs. These may be prescribed if the risk of delivery is high. ? Medicines to prevent your baby from developing cerebral palsy.  Bed rest. If the labor happens before 34 weeks of  pregnancy, you may need to stay in the hospital.  Delivery of the baby. Follow these instructions at home:  Do not use any products that contain nicotine or tobacco, such as cigarettes, e-cigarettes, and chewing tobacco. If you need help quitting, ask your health care provider.  Do not drink alcohol.  Take over-the-counter and prescription medicines only as told by your health care provider.  Rest as told by your health care provider.  Return to your normal activities as told by your health care provider. Ask your health care provider what activities are safe for you.  Keep all follow-up visits as told by your health care provider. This is important.   How is this prevented? To increase your chance of having a full-term pregnancy:  Do not use street drugs or medicines that have not been prescribed to you during your pregnancy.  Talk with your health care provider before taking any herbal supplements, even if you have been taking them regularly.  Make sure you gain a healthy amount of weight during your pregnancy.  Watch for infection. If you think that you might have an infection, get it checked right away. Symptoms of infection may include: ? Fever. ? Abnormal vaginal discharge or discharge that smells bad. ? Pain or  burning with urination. ? Needing to urinate urgently. ? Frequently urinating or passing small amounts of urine frequently. ? Blood in your urine. ? Urine that smells bad or unusual.  Tell your health care provider if you have had preterm labor before. Contact a health care provider if:  You think you are going into preterm labor.  You have signs or symptoms of preterm labor.  You have symptoms of infection. Get help right away if:  You are having regular, painful contractions every 5 minutes or less.  Your water breaks. Summary  Preterm labor is labor that starts before you reach 37 weeks of pregnancy.  Delivering your baby early increases your baby's risk of developing lifelong problems.  The exact cause of preterm labor is unknown. However, having an abnormal uterus, an STI (sexually transmitted infection), or vaginal bleeding during pregnancy increases your risk for preterm labor.  Keep all follow-up visits as told by your health care provider. This is important.  Contact a health care provider if you have signs or symptoms of preterm labor. This information is not intended to replace advice given to you by your health care provider. Make sure you discuss any questions you have with your health care provider. Document Revised: 03/28/2019 Document Reviewed: 03/28/2019 Elsevier Patient Education  2021 [employer].

## 2020-06-05 NOTE — Progress Notes (Signed)
I connected with [name] 06/05/20 at  9:35 AM EDT by: MyChart video and verified that I am speaking with the correct person using two identifiers.  Patient is located at home and provider is located at [facility].     The purpose of this virtual visit is to provide medical care while limiting exposure to the novel coronavirus. I discussed the limitations, risks, security and privacy concerns of performing an evaluation and management service by MyChart video and the availability of in person appointments. I also discussed with the patient that there may be a patient responsible charge related to this service. By engaging in this virtual visit, you consent to the provision of healthcare.  Additionally, you authorize for your insurance to be billed for the services provided during this visit.  The patient expressed understanding and agreed to proceed.  The following staff members participated in the virtual visit:  Donia Ast    PRENATAL VISIT NOTE  Subjective:  [name] is a 30 y.o. G3P2002 at [redacted]w[redacted]d  for phone visit for ongoing prenatal care.  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  Patient reports constipation.  Contractions: Not present. Vag. Bleeding: None.  Movement: Absent. Denies leaking of fluid.   The following portions of the patient's history were reviewed and updated as appropriate: allergies, current medications, past family history, past medical history, past social history, past surgical history and problem list.   Objective:  There were no vitals filed for this visit. Pt does not have BP cuff at home, will schedule for BP check in  office this week.  Fetal Status:     Movement: Absent     Assessment and Plan:  Pregnancy: G3P2002 at [redacted]w[redacted]d  1. Encounter for supervision of other normal pregnancy in first trimester -needs repeat genetic screen, pt aware and agrees -CF carrier, declines GC, pt aware that partner is not a carrier for CF as partner has previously been tested  2. [redacted] weeks gestation of pregnancy  3. Nausea and vomiting during pregnancy - no concerns at this time, pt reports is significantly better than during first trimester - pt controlling N/V through diet - no longer vomits since start of second trimester  4. Constipation during pregnancy in second trimester - last regular BM 3 days ago, normally goes daily - pt reports she can go up to two weeks between regular BMs, but does pass some stool daily - RX Colace - pt given Miralax protocol and safe meds in pregnancy list and discussed immediate relief medications/enemas, PRN  Preterm labor symptoms and general obstetric precautions including but not limited to vaginal bleeding, contractions, leaking of fluid and fetal movement were reviewed in detail with the patient. I discussed the assessment and treatment plan with the patient. The patient was provided an opportunity to ask questions and all were answered. The patient agreed with the plan and demonstrated an understanding of the instructions. The patient was advised to call back or seek an in-person office evaluation/go to MAU at [facility] for any urgent or concerning symptoms.  Return in about 4 weeks (around 07/03/2020) for in-person LOB/APP OK/AFP/repeat genetic testing (see below as well). pt needs BP check ONLY scheduled this week,  can do labs at that time, if desired  Future Appointments  Date Time Provider Department Center  07/01/2020  9:30 AM [facility] NURSE [facility]  07/01/2020  9:45 AM [facility] US4 [facility]     Time spent on virtual visit: 9 minutes  Marylen Ponto, NP

## 2020-06-05 NOTE — Progress Notes (Signed)
Virtual ROB [redacted]w[redacted]d.  XO:VANVB abdominal/pelvic pain none since Saturday. Notes being scared to eat certain foods due to N&V. Pt does not have any Rx to help with Nausea. Also notes constipation Last BM  3 days ago can go 2 weeks w/o a BM.  Pt notes drinking 1 glass of water a day.

## 2020-06-07 ENCOUNTER — Ambulatory Visit (INDEPENDENT_AMBULATORY_CARE_PROVIDER_SITE_OTHER): Payer: Medicaid Other

## 2020-06-07 ENCOUNTER — Other Ambulatory Visit: Payer: Medicaid Other

## 2020-06-07 ENCOUNTER — Other Ambulatory Visit: Payer: Self-pay

## 2020-06-07 VITALS — BP 112/70 | HR 81 | Wt 151.8 lb

## 2020-06-07 DIAGNOSIS — Z0130 Encounter for examination of blood pressure without abnormal findings: Secondary | ICD-10-CM

## 2020-06-07 DIAGNOSIS — Z3481 Encounter for supervision of other normal pregnancy, first trimester: Secondary | ICD-10-CM

## 2020-06-07 NOTE — Progress Notes (Signed)
Patient was assessed and managed by nursing staff during this encounter. I have reviewed the chart and agree with the documentation and plan. I have also made any necessary editorial changes.  Harris Kistler, MD 06/07/2020 10:37 AM 

## 2020-06-07 NOTE — Progress Notes (Signed)
Subjective:  [name] is a 30 y.o. female here for BP check, AFP, and repeat Horizon.   Hypertension ROS: taking medications as instructed, no medication side effects noted, no TIA's, no chest pain on exertion, no dyspnea on exertion and no swelling of ankles  Objective:  BP 112/70 LMP 02/16/2020   Appearance alert, well appearing, and in no distress. General exam BP noted to be well controlled today in office.   Assessment:   Blood Pressure well controlled.   Plan:  Repeat Horizon and routine AFP completed  Keep upcoming appt.

## 2020-06-09 LAB — AFP, SERUM, OPEN SPINA BIFIDA
AFP MoM: 1.53
AFP Value: 58.3 ng/mL
Gest. Age on Collection Date: 16 weeks
Maternal Age At EDD: 28.5 yr
OSBR Risk 1 IN: 5046
Test Results:: NEGATIVE
Weight: 151 [lb_av]

## 2020-06-10 ENCOUNTER — Encounter: Payer: Self-pay | Admitting: Obstetrics & Gynecology

## 2020-06-17 ENCOUNTER — Encounter: Payer: Self-pay | Admitting: Obstetrics & Gynecology

## 2020-06-24 ENCOUNTER — Encounter: Payer: Self-pay | Admitting: Obstetrics & Gynecology

## 2020-07-01 ENCOUNTER — Encounter: Payer: Self-pay | Admitting: *Deleted

## 2020-07-01 ENCOUNTER — Ambulatory Visit: Payer: Medicaid Other | Attending: Women's Health

## 2020-07-01 ENCOUNTER — Other Ambulatory Visit: Payer: Self-pay | Admitting: *Deleted

## 2020-07-01 ENCOUNTER — Other Ambulatory Visit: Payer: Self-pay

## 2020-07-01 ENCOUNTER — Ambulatory Visit: Payer: Medicaid Other | Admitting: *Deleted

## 2020-07-01 DIAGNOSIS — Z3480 Encounter for supervision of other normal pregnancy, unspecified trimester: Secondary | ICD-10-CM | POA: Insufficient documentation

## 2020-07-01 DIAGNOSIS — Z3402 Encounter for supervision of normal first pregnancy, second trimester: Secondary | ICD-10-CM | POA: Insufficient documentation

## 2020-07-01 DIAGNOSIS — Z362 Encounter for other antenatal screening follow-up: Secondary | ICD-10-CM

## 2020-07-03 ENCOUNTER — Other Ambulatory Visit: Payer: Self-pay

## 2020-07-03 ENCOUNTER — Ambulatory Visit (INDEPENDENT_AMBULATORY_CARE_PROVIDER_SITE_OTHER): Payer: Medicaid Other | Admitting: Women's Health

## 2020-07-03 VITALS — BP 106/69 | HR 71 | Wt 159.0 lb

## 2020-07-03 DIAGNOSIS — O99612 Diseases of the digestive system complicating pregnancy, second trimester: Secondary | ICD-10-CM

## 2020-07-03 DIAGNOSIS — Z141 Cystic fibrosis carrier: Secondary | ICD-10-CM

## 2020-07-03 DIAGNOSIS — K5900 Constipation, unspecified: Secondary | ICD-10-CM

## 2020-07-03 DIAGNOSIS — Z3402 Encounter for supervision of normal first pregnancy, second trimester: Secondary | ICD-10-CM

## 2020-07-03 DIAGNOSIS — O219 Vomiting of pregnancy, unspecified: Secondary | ICD-10-CM

## 2020-07-03 DIAGNOSIS — Z8759 Personal history of other complications of pregnancy, childbirth and the puerperium: Secondary | ICD-10-CM

## 2020-07-03 DIAGNOSIS — Z3A19 19 weeks gestation of pregnancy: Secondary | ICD-10-CM

## 2020-07-03 NOTE — Progress Notes (Signed)
Subjective:  [name] is a 30 y.o. G3P2002 at [redacted]w[redacted]d being seen today for ongoing prenatal care.  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  Patient reports no complaints.  Contractions: Not present. Vag. Bleeding: None.  Movement: Present. Denies leaking of fluid.   The following portions of the patient's history were reviewed and updated as appropriate: allergies, current medications, past family history, past medical history, past social history, past surgical history and problem list. Problem list updated.  Objective:   Vitals:   07/03/20 1013  BP: 106/69  Pulse: 71  Weight: 159 lb (72.1 kg)    Fetal Status: Fetal Heart Rate (bpm): 146   Movement: Present     General:  Alert, oriented and cooperative. Patient is in no acute distress.  Skin: Skin is warm and dry. No rash noted.   Cardiovascular: Normal heart rate noted  Respiratory: Normal respiratory effort, no problems with respiration noted  Abdomen: Soft, gravid, appropriate for gestational age. Pain/Pressure: Absent     Pelvic: Vag. Bleeding: None     Cervical exam deferred        Extremities: Normal range of motion.  Edema: None  Mental Status: Normal mood and affect. Normal behavior. Normal judgment and thought content.   Urinalysis:      Assessment and Plan:  Pregnancy: G3P2002 at [redacted]w[redacted]d  1. Encounter for supervision of normal first pregnancy in second trimester -contraception discussed, pt unsure, would like long-term method, but is unsure which method -[facility] info given -f/u anatomy 07/29/2020  2. Constipation during pregnancy in second trimester -no concerns  3. Nausea  and vomiting during pregnancy -no concerns  4. History of third degree perineal laceration, no complications  5. 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 [redacted] weeks gestation of pregnancy  Preterm labor symptoms and general obstetric precautions including but not limited to vaginal bleeding, contractions, leaking of fluid and fetal movement were reviewed in detail with the patient. I discussed the assessment and treatment plan with the patient. The patient was provided an opportunity to ask questions and all were answered. The patient agreed with the plan and demonstrated an understanding of the instructions. The patient was advised to call back or seek an in-person office evaluation/go to [facility] at [facility] for any urgent or concerning symptoms. Please refer to After Visit Summary for other counseling recommendations.  Return in about 4 weeks (around 07/31/2020) for in-person LOB/APP OK.   Tracia Lacomb, Odie Sera, NP

## 2020-07-03 NOTE — Patient Instructions (Signed)
Maternity Assessment Unit (MAU)  The Maternity Assessment Unit (MAU) is located at the [facility] at [facility]. The address is: [address] [place], [place], [place] [address]. Please see map below for additional directions.    The Maternity Assessment Unit is designed to help you during your pregnancy, and for up to 6 weeks after delivery, with any pregnancy- or postpartum-related emergencies, if you think you are in labor, or if your water has broken. For example, if you experience nausea and vomiting, vaginal bleeding, severe abdominal or pelvic pain, elevated blood pressure or other problems related to your pregnancy or postpartum time, please come to the Maternity Assessment Unit for assistance.       Childbirth Education Options: [facility] Department Classes:  Childbirth education classes can help you get ready for a positive parenting experience. You can also meet other expectant parents and get free stuff for your baby. Each class runs for five weeks on the same night and costs $45 for the mother-to-be and her support person. Medicaid covers the cost if you are eligible. Call [phone] to register. [facility] Childbirth Education:  [phone] or [phone] or [email or web address]  Baby & Me Class: Discuss newborn & infant parenting and family adjustment issues with other new mothers in a relaxed environment. Each week brings a new speaker or baby-centered activity. We encourage new mothers to join [place] every Thursday at 11:00am. Babies birth until crawling. No registration or fee. Daddy [employer]: This course offers Dads-to-be the tools and knowledge needed to feel confident on their journey to becoming new fathers. Experienced dads, who have been trained as coaches, teach dads-to-be how to hold, comfort, diaper, swaddle and play with their infant while being able to support the new mom as well. A class for men taught by  men. $25/dad Big Brother/Big Sister: Let your children share in the joy of a new brother or sister in this special class designed just for them. Class includes discussion about how families care for babies: swaddling, holding, diapering, safety as well as how they can be helpful in their new role. This class is designed for children ages 2 to 39, but any age is welcome. Please register each child individually. $5/child  Mom Talk: This mom-led group offers support and connection to mothers as they journey through the adjustments and struggles of that sometimes overwhelming first year after the birth of a child. Tuesdays at 10:00am and Thursdays at 6:00pm. Babies welcome. No registration or fee. Breastfeeding Support Group: This group is a mother-to-mother support circle where moms have the opportunity to share their breastfeeding experiences. A Lactation Consultant is present for questions and concerns. Meets each Tuesday at 11:00am. No fee or registration. Breastfeeding Your Baby: Learn what to expect in the first days of breastfeeding your newborn.  This class will help you feel more confident with the skills needed to begin your breastfeeding experience. Many new mothers are concerned about breastfeeding after leaving the hospital. This class will also address the most common fears and challenges about breastfeeding during the first few weeks, months and beyond. (call for fee) Comfort Techniques and Tour: This 2 hour interactive class will provide you the opportunity to learn & practice hands-on techniques that can help relieve some of the discomfort of labor and encourage your baby to rotate toward the best position for birth. You and your partner will be able to try a variety of labor positions with birth balls and rebozos as well  as practice breathing, relaxation, and visualization techniques. A tour of the [facility] is included with this class. $20 per registrant and support  person Childbirth Class- Weekend Option: This class is a Weekend version of our Birth & Baby series. It is designed for parents who have a difficult time fitting several weeks of classes into their schedule. It covers the care of your newborn and the basics of labor and childbirth. It also includes a Maternity Care Center Tour of [facility] and lunch. The class is held two consecutive days: beginning on Friday evening from 6:30 - 8:30 p.m. and the next day, Saturday from 9 a.m. - 4 p.m. (call for fee) Linden Dolin Class: Interested in a waterbirth?  This informational class will help you discover whether waterbirth is the right fit for you. Education about waterbirth itself, supplies you would need and how to assemble your support team is what you can expect from this class. Some obstetrical practices require this class in order to pursue a waterbirth. (Not all obstetrical practices offer waterbirth-check with your healthcare provider.) Register only the expectant mom, but you are encouraged to bring your partner to class! Required if planning waterbirth, no fee. Infant/Child CPR: Parents, grandparents, babysitters, and friends learn Cardio-Pulmonary Resuscitation skills for infants and children. You will also learn how to treat both conscious and unconscious choking in infants and children. This Family & Friends program does not offer certification. Register each participant individually to ensure that enough mannequins are available. (Call for fee) Grandparent Love: Expecting a grandbaby? This class is for you! Learn about the latest infant care and safety recommendations and ways to support your own child as he or she transitions into the parenting role. Taught by Registered Nurses who are childbirth instructors, but most importantly...they are grandmothers too! $10/person. Childbirth Class- Natural Childbirth: This series of 5 weekly classes is for expectant parents who want to learn and practice  natural methods of coping with the process of labor and childbirth. Relaxation, breathing, massage, visualization, role of the partner, and helpful positioning are highlighted. Participants learn how to be confident in their body's ability to give birth. This class will empower and help parents make informed decisions about their own care. Includes discussion that will help new parents transition into the immediate postpartum period. Maternity Care Center Tour of [facility] is included. We suggest taking this class between 25-32 weeks, but it's only a recommendation. $75 per registrant and one support person or $30 Medicaid. Childbirth Class- 3 week Series: This option of 3 weekly classes helps you and your labor partner prepare for childbirth. Newborn care, labor & birth, cesarean birth, pain management, and comfort techniques are discussed and a Maternity Care Center Tour of [facility] is included. The class meets at the same time, on the same day of the week for 3 consecutive weeks beginning with the starting date you choose. $60 for registrant and one support person.  Marvelous Multiples: Expecting twins, triplets, or more? This class covers the differences in labor, birth, parenting, and breastfeeding issues that face multiples' parents. NICU tour is included. Led by a Certified Childbirth Educator who is the mother of twins. No fee. Caring for Baby: This class is for expectant and adoptive parents who want to learn and practice the most up-to-date newborn care for their babies. Focus is on birth through the first six weeks of life. Topics include feeding, bathing, diapering, crying, umbilical cord care, circumcision care and safe sleep. Parents learn to  recognize symptoms of illness and when to call the pediatrician. Register only the mom-to-be and your partner or support person can plan to come with you! $10 per registrant and support person Childbirth Class- online option: This online class  offers you the freedom to complete a Birth and Baby series in the comfort of your own home. The flexibility of this option allows you to review sections at your own pace, at times convenient to you and your support people. It includes additional video information, animations, quizzes, and extended activities. Get organized with helpful eClass tools, checklists, and trackers. Once you register online for the class, you will receive an email within a few days to accept the invitation and begin the class when the time is right for you. The content will be available to you for 60 days. $60 for 60 days of online access for you and your support people.               Contraception Choices - www.bedsider.org Contraception, also called birth control, refers to methods or devices that prevent pregnancy. Hormonal methods Contraceptive implant A contraceptive implant is a thin, plastic tube that contains a hormone that prevents pregnancy. It is different from an intrauterine device (IUD). It is inserted into the upper part of the arm by a health care provider. Implants can be effective for up to 3 years. Progestin-only injections Progestin-only injections are injections of progestin, a synthetic form of the hormone progesterone. They are given every 3 months by a health care provider. Birth control pills Birth control pills are pills that contain hormones that prevent pregnancy. They must be taken once a day, preferably at the same time each day. A prescription is needed to use this method of contraception. Birth control patch The birth control patch contains hormones that prevent pregnancy. It is placed on the skin and must be changed once a week for three weeks and removed on the fourth week. A prescription is needed to use this method of contraception. Vaginal ring A vaginal ring contains hormones that prevent pregnancy. It is placed in the vagina for three weeks and removed on the fourth week.  After that, the process is repeated with a new ring. A prescription is needed to use this method of contraception. Emergency contraceptive Emergency contraceptives prevent pregnancy after unprotected sex. They come in pill form and can be taken up to 5 days after sex. They work best the sooner they are taken after having sex. Most emergency contraceptives are available without a prescription. This method should not be used as your only form of birth control.   Barrier methods Female condom A female condom is a thin sheath that is worn over the penis during sex. Condoms keep sperm from going inside a woman's body. They can be used with a sperm-killing substance (spermicide) to increase their effectiveness. They should be thrown away after one use. Female condom A female condom is a soft, loose-fitting sheath that is put into the vagina before sex. The condom keeps sperm from going inside a woman's body. They should be thrown away after one use. Diaphragm A diaphragm is a soft, dome-shaped barrier. It is inserted into the vagina before sex, along with a spermicide. The diaphragm blocks sperm from entering the uterus, and the spermicide kills sperm. A diaphragm should be left in the vagina for 6-8 hours after sex and removed within 24 hours. A diaphragm is prescribed and fitted by a health care provider. A diaphragm should be replaced  every 1-2 years, after giving birth, after gaining more than 15 lb (6.8 kg), and after pelvic surgery. Cervical cap A cervical cap is a round, soft latex or plastic cup that fits over the cervix. It is inserted into the vagina before sex, along with spermicide. It blocks sperm from entering the uterus. The cap should be left in place for 6-8 hours after sex and removed within 48 hours. A cervical cap must be prescribed and fitted by a health care provider. It should be replaced every 2 years. Sponge A sponge is a soft, circular piece of polyurethane foam with spermicide in it.  The sponge helps block sperm from entering the uterus, and the spermicide kills sperm. To use it, you make it wet and then insert it into the vagina. It should be inserted before sex, left in for at least 6 hours after sex, and removed and thrown away within 30 hours. Spermicides Spermicides are chemicals that kill or block sperm from entering the cervix and uterus. They can come as a cream, jelly, suppository, foam, or tablet. A spermicide should be inserted into the vagina with an applicator at least 10-15 minutes before sex to allow time for it to work. The process must be repeated every time you have sex. Spermicides do not require a prescription.   Intrauterine contraception Intrauterine device (IUD) An IUD is a T-shaped device that is put in a woman's uterus. There are two types:  Hormone IUD.This type contains progestin, a synthetic form of the hormone progesterone. This type can stay in place for 3-5 years.  Copper IUD.This type is wrapped in copper wire. It can stay in place for 10 years. Permanent methods of contraception Female tubal ligation In this method, a woman's fallopian tubes are sealed, tied, or blocked during surgery to prevent eggs from traveling to the uterus. Hysteroscopic sterilization In this method, a small, flexible insert is placed into each fallopian tube. The inserts cause scar tissue to form in the fallopian tubes and block them, so sperm cannot reach an egg. The procedure takes about 3 months to be effective. Another form of birth control must be used during those 3 months. Female sterilization This is a procedure to tie off the tubes that carry sperm (vasectomy). After the procedure, the man can still ejaculate fluid (semen). Another form of birth control must be used for 3 months after the procedure. Natural planning methods Natural family planning In this method, a couple does not have sex on days when the woman could become pregnant. Calendar method In this  method, the woman keeps track of the length of each menstrual cycle, identifies the days when pregnancy can happen, and does not have sex on those days. Ovulation method In this method, a couple avoids sex during ovulation. Symptothermal method This method involves not having sex during ovulation. The woman typically checks for ovulation by watching changes in her temperature and in the consistency of cervical mucus. Post-ovulation method In this method, a couple waits to have sex until after ovulation. Where to find more information  Centers for Disease Control and Prevention: [employer].br Summary  Contraception, also called birth control, refers to methods or devices that prevent pregnancy.  Hormonal methods of contraception include implants, injections, pills, patches, vaginal rings, and emergency contraceptives.  Barrier methods of contraception can include female condoms, female condoms, diaphragms, cervical caps, sponges, and spermicides.  There are two types of IUDs (intrauterine devices). An IUD can be put in a woman's uterus to prevent  pregnancy for 3-5 years.  Permanent sterilization can be done through a procedure for males and females. Natural family planning methods involve nothaving sex on days when the woman could become pregnant. This information is not intended to replace advice given to you by your health care provider. Make sure you discuss any questions you have with your health care provider. Document Revised: 07/31/2019 Document Reviewed: 07/31/2019 Elsevier Patient Education  2021 Elsevier Inc.        Preterm Labor The normal length of a pregnancy is 39-41 weeks. Preterm labor is when labor starts before 37 completed weeks of pregnancy. Babies who are born prematurely and survive may not be fully developed and may be at an increased risk for long-term problems such as cerebral palsy, developmental delays, and vision and hearing problems. Babies who are born too  early may have problems soon after birth. Problems may include regulating blood sugar, body temperature, heart rate, and breathing rate. These babies often have trouble with feeding. The risk of having problems is highest for babies who are born before 34 weeks of pregnancy. What are the causes? The exact cause of this condition is not known. What increases the risk? You are more likely to have preterm labor if you have certain risk factors that relate to your medical history, problems with present and past pregnancies, and lifestyle factors. Medical history  You have abnormalities of the uterus, including a short cervix.  You have STIs (sexually transmitted infections), or other infections of the urinary tract and the vagina.  You have chronic illnesses, such as blood clotting problems, diabetes, or high blood pressure.  You are overweight or underweight. Present and past pregnancies  You have had preterm labor before.  You are pregnant with twins or other multiples.  You have been diagnosed with a condition in which the placenta covers your cervix (placenta previa).  You waited less than 6 months between giving birth and becoming pregnant again.  Your unborn baby has some abnormalities.  You have vaginal bleeding during pregnancy.  You became pregnant through in vitro fertilization (IVF). Lifestyle and environmental factors  You use tobacco products.  You drink alcohol.  You use street drugs.  You have stress and no social support.  You experience domestic violence.  You are exposed to certain chemicals or environmental pollutants. Other factors  You are younger than age 21 or older than age 71. What are the signs or symptoms? Symptoms of this condition include:  Cramps similar to those that can happen during a menstrual period. The cramps may happen with diarrhea.  Pain in the abdomen or lower back.  Regular contractions that may feel like tightening of the  abdomen.  A feeling of increased pressure in the pelvis.  Increased watery or bloody mucus discharge from the vagina.  Water breaking (ruptured amniotic sac). How is this diagnosed? This condition is diagnosed based on:  Your medical history and a physical exam.  A pelvic exam.  An ultrasound.  Monitoring your uterus for contractions.  Other tests, including: ? A swab of the cervix to check for a chemical called fetal fibronectin. ? Urine tests. How is this treated? Treatment for this condition depends on the length of your pregnancy, your condition, and the health of your baby. Treatment may include:  Taking medicines, such as: ? Hormone medicines. These may be given early in pregnancy to help support the pregnancy. ? Medicines to stop contractions. ? Medicines to help mature the baby's lungs. These may  be prescribed if the risk of delivery is high. ? Medicines to prevent your baby from developing cerebral palsy.  Bed rest. If the labor happens before 34 weeks of pregnancy, you may need to stay in the hospital.  Delivery of the baby. Follow these instructions at home:  Do not use any products that contain nicotine or tobacco, such as cigarettes, e-cigarettes, and chewing tobacco. If you need help quitting, ask your health care provider.  Do not drink alcohol.  Take over-the-counter and prescription medicines only as told by your health care provider.  Rest as told by your health care provider.  Return to your normal activities as told by your health care provider. Ask your health care provider what activities are safe for you.  Keep all follow-up visits as told by your health care provider. This is important.   How is this prevented? To increase your chance of having a full-term pregnancy:  Do not use street drugs or medicines that have not been prescribed to you during your pregnancy.  Talk with your health care provider before taking any herbal supplements, even  if you have been taking them regularly.  Make sure you gain a healthy amount of weight during your pregnancy.  Watch for infection. If you think that you might have an infection, get it checked right away. Symptoms of infection may include: ? Fever. ? Abnormal vaginal discharge or discharge that smells bad. ? Pain or burning with urination. ? Needing to urinate urgently. ? Frequently urinating or passing small amounts of urine frequently. ? Blood in your urine. ? Urine that smells bad or unusual.  Tell your health care provider if you have had preterm labor before. Contact a health care provider if:  You think you are going into preterm labor.  You have signs or symptoms of preterm labor.  You have symptoms of infection. Get help right away if:  You are having regular, painful contractions every 5 minutes or less.  Your water breaks. Summary  Preterm labor is labor that starts before you reach 37 weeks of pregnancy.  Delivering your baby early increases your baby's risk of developing lifelong problems.  The exact cause of preterm labor is unknown. However, having an abnormal uterus, an STI (sexually transmitted infection), or vaginal bleeding during pregnancy increases your risk for preterm labor.  Keep all follow-up visits as told by your health care provider. This is important.  Contact a health care provider if you have signs or symptoms of preterm labor. This information is not intended to replace advice given to you by your health care provider. Make sure you discuss any questions you have with your health care provider. Document Revised: 03/28/2019 Document Reviewed: 03/28/2019 Elsevier Patient Education  2021 [employer]

## 2020-07-29 ENCOUNTER — Ambulatory Visit: Payer: Medicaid Other | Admitting: *Deleted

## 2020-07-29 ENCOUNTER — Ambulatory Visit: Payer: Medicaid Other | Attending: Obstetrics

## 2020-07-29 ENCOUNTER — Encounter: Payer: Self-pay | Admitting: *Deleted

## 2020-07-29 ENCOUNTER — Other Ambulatory Visit: Payer: Self-pay

## 2020-07-29 DIAGNOSIS — Z3402 Encounter for supervision of normal first pregnancy, second trimester: Secondary | ICD-10-CM | POA: Insufficient documentation

## 2020-07-29 DIAGNOSIS — Z362 Encounter for other antenatal screening follow-up: Secondary | ICD-10-CM | POA: Insufficient documentation

## 2020-07-31 ENCOUNTER — Telehealth (INDEPENDENT_AMBULATORY_CARE_PROVIDER_SITE_OTHER): Payer: Medicaid Other | Admitting: Women's Health

## 2020-07-31 ENCOUNTER — Other Ambulatory Visit: Payer: Self-pay

## 2020-07-31 DIAGNOSIS — Z3A23 23 weeks gestation of pregnancy: Secondary | ICD-10-CM

## 2020-07-31 DIAGNOSIS — Z3492 Encounter for supervision of normal pregnancy, unspecified, second trimester: Secondary | ICD-10-CM

## 2020-07-31 DIAGNOSIS — Z3402 Encounter for supervision of normal first pregnancy, second trimester: Secondary | ICD-10-CM

## 2020-07-31 NOTE — Progress Notes (Signed)
I connected with  [name] on 07/31/20 by a video enabled telemedicine application and verified that I am speaking with the correct person using two identifiers.  PATIENT: HOME PROVIDER: [name]   I discussed the limitations of evaluation and management by telemedicine. The patient expressed understanding and agreed to proceed.  MyChart OB, reports no complaints today.  She is going to pick up her BP Cuff this week.

## 2020-07-31 NOTE — Progress Notes (Signed)
I connected with [name] 07/31/20 at  9:55 AM EDT by: MyChart video and verified that I am speaking with the correct person using two identifiers.  Patient is located at home and provider is located at [facility].     The purpose of this virtual visit is to provide medical care while limiting exposure to the novel coronavirus. I discussed the limitations, risks, security and privacy concerns of performing an evaluation and management service by MyChart video and the availability of in person appointments. I also discussed with the patient that there may be a patient responsible charge related to this service. By engaging in this virtual visit, you consent to the provision of healthcare.  Additionally, you authorize for your insurance to be billed for the services provided during this visit.  The patient expressed understanding and agreed to proceed.  The following staff members participated in the virtual visit:  Donia Ast, NP & Rennis Chris, S-FNP    PRENATAL VISIT NOTE  Subjective:  [name] is a 30 y.o. G3P2002 at [redacted]w[redacted]d  for phone visit for ongoing prenatal care.  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  Patient reports no complaints.  Contractions: Not present. Vag. Bleeding: None.  Movement: Present. Denies leaking of fluid.   The following portions of the patient's history were reviewed and updated as appropriate: allergies, current medications, past family history, past medical history, past social history, past surgical history and problem list.   Objective:  There were no vitals filed for this visit. Pt has not yet picked  up her BP cuff, will schedule for in-office visit this week, or pt can send [place] BP through MyChart by end of this week.  Fetal Status:     Movement: Present     Assessment and Plan:  Pregnancy: G3P2002 at [redacted]w[redacted]d 1. Encounter for supervision of normal first pregnancy in second trimester -GTT/labs next visit  Preterm labor symptoms and general obstetric precautions including but not limited to vaginal bleeding, contractions, leaking of fluid and fetal movement were reviewed in detail with the patient.  Return in about 4 weeks (around 08/28/2020) for in-person LOB/APP OK/GTT/labs, pt needs BP check by end of week.  No future appointments.   Time spent on virtual visit: 5 minutes  Marylen Ponto, NP

## 2020-07-31 NOTE — Patient Instructions (Addendum)
Maternity Assessment Unit (MAU)  The Maternity Assessment Unit (MAU) is located at the [facility] and [place] [facility]. The address is: [address], [place], [place], [place] [address]. Please see map below for additional directions.    The Maternity Assessment Unit is designed to help you during your pregnancy, and for up to 6 weeks after delivery, with any pregnancy- or postpartum-related emergencies, if you think you are in labor, or if your water has broken. For example, if you experience nausea and vomiting, vaginal bleeding, severe abdominal or pelvic pain, elevated blood pressure or other problems related to your pregnancy or postpartum time, please come to the Maternity Assessment Unit for assistance.        Preterm Labor The normal length of a pregnancy is 39-41 weeks. Preterm labor is when labor starts before 37 completed weeks of pregnancy. Babies who are born prematurely and survive may not be fully developed and may be at an increased risk for long-term problems such as cerebral palsy, developmental delays, and vision and hearing problems. Babies who are born too early may have problems soon after birth. Problems may include regulating blood sugar, body temperature, heart rate, and breathing rate. These babies often have trouble with feeding. The risk of having problems is highest for babies who are born before 44 weeks of pregnancy. What are the causes? The exact cause of this condition is not known. What increases the risk? You are more likely to have preterm labor if you have certain risk factors that relate to your medical history, problems with present and past pregnancies, and lifestyle factors. Medical history  You have abnormalities of the uterus, including a short cervix.  You have STIs (sexually transmitted infections), or other infections of the urinary tract and the vagina.  You have chronic illnesses, such as blood clotting problems,  diabetes, or high blood pressure.  You are overweight or underweight. Present and past pregnancies  You have had preterm labor before.  You are pregnant with twins or other multiples.  You have been diagnosed with a condition in which the placenta covers your cervix (placenta previa).  You waited less than 6 months between giving birth and becoming pregnant again.  Your unborn baby has some abnormalities.  You have vaginal bleeding during pregnancy.  You became pregnant through in vitro fertilization (IVF). Lifestyle and environmental factors  You use tobacco products.  You drink alcohol.  You use street drugs.  You have stress and no social support.  You experience domestic violence.  You are exposed to certain chemicals or environmental pollutants. Other factors  You are younger than age 110 or older than age 19. What are the signs or symptoms? Symptoms of this condition include:  Cramps similar to those that can happen during a menstrual period. The cramps may happen with diarrhea.  Pain in the abdomen or lower back.  Regular contractions that may feel like tightening of the abdomen.  A feeling of increased pressure in the pelvis.  Increased watery or bloody mucus discharge from the vagina.  Water breaking (ruptured amniotic sac). How is this diagnosed? This condition is diagnosed based on:  Your medical history and a physical exam.  A pelvic exam.  An ultrasound.  Monitoring your uterus for contractions.  Other tests, including: ? A swab of the cervix to check for a chemical called fetal fibronectin. ? Urine tests. How is this treated? Treatment for this condition depends on the length of your pregnancy, your  condition, and the health of your baby. Treatment may include:  Taking medicines, such as: ? Hormone medicines. These may be given early in pregnancy to help support the pregnancy. ? Medicines to stop contractions. ? Medicines to help mature  the baby's lungs. These may be prescribed if the risk of delivery is high. ? Medicines to prevent your baby from developing cerebral palsy.  Bed rest. If the labor happens before 34 weeks of pregnancy, you may need to stay in the hospital.  Delivery of the baby. Follow these instructions at home:  Do not use any products that contain nicotine or tobacco, such as cigarettes, e-cigarettes, and chewing tobacco. If you need help quitting, ask your health care provider.  Do not drink alcohol.  Take over-the-counter and prescription medicines only as told by your health care provider.  Rest as told by your health care provider.  Return to your normal activities as told by your health care provider. Ask your health care provider what activities are safe for you.  Keep all follow-up visits as told by your health care provider. This is important.   How is this prevented? To increase your chance of having a full-term pregnancy:  Do not use street drugs or medicines that have not been prescribed to you during your pregnancy.  Talk with your health care provider before taking any herbal supplements, even if you have been taking them regularly.  Make sure you gain a healthy amount of weight during your pregnancy.  Watch for infection. If you think that you might have an infection, get it checked right away. Symptoms of infection may include: ? Fever. ? Abnormal vaginal discharge or discharge that smells bad. ? Pain or burning with urination. ? Needing to urinate urgently. ? Frequently urinating or passing small amounts of urine frequently. ? Blood in your urine. ? Urine that smells bad or unusual.  Tell your health care provider if you have had preterm labor before. Contact a health care provider if:  You think you are going into preterm labor.  You have signs or symptoms of preterm labor.  You have symptoms of infection. Get help right away if:  You are having regular, painful  contractions every 5 minutes or less.  Your water breaks. Summary  Preterm labor is labor that starts before you reach 37 weeks of pregnancy.  Delivering your baby early increases your baby's risk of developing lifelong problems.  The exact cause of preterm labor is unknown. However, having an abnormal uterus, an STI (sexually transmitted infection), or vaginal bleeding during pregnancy increases your risk for preterm labor.  Keep all follow-up visits as told by your health care provider. This is important.  Contact a health care provider if you have signs or symptoms of preterm labor. This information is not intended to replace advice given to you by your health care provider. Make sure you discuss any questions you have with your health care provider. Document Revised: 03/28/2019 Document Reviewed: 03/28/2019 Elsevier Patient Education  2021 Elsevier Inc.        Oral Glucose Tolerance Test During Pregnancy Why am I having this test? The oral glucose tolerance test (OGTT) is done to check how your body processes blood sugar (glucose). This is one of several tests used to diagnose diabetes that develops during pregnancy (gestational diabetes mellitus). Gestational diabetes is a short-term form of diabetes that some women develop while they are pregnant. It usually occurs during the second trimester of pregnancy and goes away after  delivery. Testing, or screening, for gestational diabetes usually occurs at weeks 24-28 of pregnancy. You may have the OGTT test after having a 1-hour glucose screening test if the results from that test indicate that you may have gestational diabetes. This test may also be needed if:  You have a history of gestational diabetes.  There is a history of giving birth to very large babies or of losing pregnancies (having stillbirths).  You have signs and symptoms of diabetes, such as: ? Changes in your eyesight. ? Tingling or numbness in your hands or  feet. ? Changes in hunger, thirst, and urination, and these are not explained by your pregnancy. What is being tested? This test measures the amount of glucose in your blood at different times during a period of 3 hours. This shows how well your body can process glucose. What kind of sample is taken? Blood samples are required for this test. They are usually collected by inserting a needle into a blood vessel.   How do I prepare for this test?  For 3 days before your test, eat normally. Have plenty of carbohydrate-rich foods.  Follow instructions from your health care provider about: ? Eating or drinking restrictions on the day of the test. You may be asked not to eat or drink anything other than water (to fast) starting 8-10 hours before the test. ? Changing or stopping your regular medicines. Some medicines may interfere with this test. Tell a health care provider about:  All medicines you are taking, including vitamins, herbs, eye drops, creams, and over-the-counter medicines.  Any blood disorders you have.  Any surgeries you have had.  Any medical conditions you have. What happens during the test? First, your blood glucose will be measured. This is referred to as your fasting blood glucose because you fasted before the test. Then, you will drink a glucose solution that contains a certain amount of glucose. Your blood glucose will be measured again 1, 2, and 3 hours after you drink the solution. This test takes about 3 hours to complete. You will need to stay at the testing location during this time. During the testing period:  Do not eat or drink anything other than the glucose solution.  Do not exercise.  Do not use any products that contain nicotine or tobacco, such as cigarettes, e-cigarettes, and chewing tobacco. These can affect your test results. If you need help quitting, ask your health care provider. The testing procedure may vary among health care providers and  hospitals. How are the results reported? Your results will be reported as milligrams of glucose per deciliter of blood (mg/dL) or millimoles per liter (mmol/L). There is more than one source for screening and diagnosis reference values used to diagnose gestational diabetes. Your health care provider will compare your results to normal values that were established after testing a large group of people (reference values). Reference values may vary among labs and hospitals. For this test (Carpenter-Coustan), reference values are:  Fasting: 95 mg/dL (5.3 mmol/L).  1 hour: 180 mg/dL (24.2 mmol/L).  2 hour: 155 mg/dL (8.6 mmol/L).  3 hour: 140 mg/dL (7.8 mmol/L). What do the results mean? Results below the reference values are considered normal. If two or more of your blood glucose levels are at or above the reference values, you may be diagnosed with gestational diabetes. If only one level is high, your health care provider may suggest repeat testing or other tests to confirm a diagnosis. Talk with your health care provider  about what your results mean. Questions to ask your health care provider Ask your health care provider, or the department that is doing the test:  When will my results be ready?  How will I get my results?  What are my treatment options?  What other tests do I need?  What are my next steps? Summary  The oral glucose tolerance test (OGTT) is one of several tests used to diagnose diabetes that develops during pregnancy (gestational diabetes mellitus). Gestational diabetes is a short-term form of diabetes that some women develop while they are pregnant.  You may have the OGTT test after having a 1-hour glucose screening test if the results from that test show that you may have gestational diabetes. You may also have this test if you have any symptoms or risk factors for this type of diabetes.  Talk with your health care provider about what your results mean. This  information is not intended to replace advice given to you by your health care provider. Make sure you discuss any questions you have with your health care provider. Document Revised: 08/03/2019 Document Reviewed: 08/03/2019 Elsevier Patient Education  2021 Elsevier Inc.  

## 2020-08-02 ENCOUNTER — Other Ambulatory Visit: Payer: Self-pay

## 2020-08-02 ENCOUNTER — Ambulatory Visit (INDEPENDENT_AMBULATORY_CARE_PROVIDER_SITE_OTHER): Payer: Medicaid Other

## 2020-08-02 VITALS — BP 109/72 | HR 98

## 2020-08-02 DIAGNOSIS — Z0130 Encounter for examination of blood pressure without abnormal findings: Secondary | ICD-10-CM

## 2020-08-02 NOTE — Progress Notes (Signed)
..  Subjective:  [name] is a 30 y.o. female here for BP check.   Hypertension ROS: no TIA's, no chest pain on exertion, no dyspnea on exertion and no swelling of ankles.    Objective:  BP 109/72   Pulse 98   LMP 02/16/2020   Appearance alert, well appearing, and in no distress. General exam BP noted to be well controlled today in office.    Assessment:   Blood Pressure well controlled.   Plan:  Current treatment plan is effective, no change in therapy.Marland Kitchen

## 2020-08-02 NOTE — Progress Notes (Signed)
Agree with A & P. 

## 2020-08-28 ENCOUNTER — Other Ambulatory Visit: Payer: Self-pay

## 2020-08-28 ENCOUNTER — Ambulatory Visit (INDEPENDENT_AMBULATORY_CARE_PROVIDER_SITE_OTHER): Payer: Medicaid Other | Admitting: Women's Health

## 2020-08-28 ENCOUNTER — Other Ambulatory Visit: Payer: Medicaid Other

## 2020-08-28 VITALS — BP 128/88 | HR 103 | Wt 173.0 lb

## 2020-08-28 DIAGNOSIS — Z3402 Encounter for supervision of normal first pregnancy, second trimester: Secondary | ICD-10-CM | POA: Diagnosis not present

## 2020-08-28 DIAGNOSIS — O26899 Other specified pregnancy related conditions, unspecified trimester: Secondary | ICD-10-CM

## 2020-08-28 DIAGNOSIS — Z141 Cystic fibrosis carrier: Secondary | ICD-10-CM

## 2020-08-28 DIAGNOSIS — Z23 Encounter for immunization: Secondary | ICD-10-CM | POA: Diagnosis not present

## 2020-08-28 DIAGNOSIS — Z3A27 27 weeks gestation of pregnancy: Secondary | ICD-10-CM

## 2020-08-28 DIAGNOSIS — R12 Heartburn: Secondary | ICD-10-CM

## 2020-08-28 NOTE — Progress Notes (Signed)
Subjective:  [name] is a 30 y.o. G3P2002 at [redacted]w[redacted]d being seen today for ongoing prenatal care.  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  Patient reports no complaints.  Contractions: Not present. Vag. Bleeding: None.  Movement: Present. Denies leaking of fluid.   The following portions of the patient's history were reviewed and updated as appropriate: allergies, current medications, past family history, past medical history, past social history, past surgical history and problem list. Problem list updated.  Objective:   Vitals:   08/28/20 0958  BP: 128/88  Pulse: (!) 103  Weight: 173 lb (78.5 kg)    Fetal Status: Fetal Heart Rate (bpm): 140   Movement: Present     General:  Alert, oriented and cooperative. Patient is in no acute distress.  Skin: Skin is warm and dry. No rash noted.   Cardiovascular: Normal heart rate noted  Respiratory: Normal respiratory effort, no problems with respiration noted  Abdomen: Soft, gravid, appropriate for gestational age. Pain/Pressure: Absent     Pelvic: Vag. Bleeding: None     Cervical exam deferred        Extremities: Normal range of motion.     Mental Status: Normal mood and affect. Normal behavior. Normal judgment and thought content.   Urinalysis:      Assessment and Plan:  Pregnancy: G3P2002 at [redacted]w[redacted]d  1. Encounter for supervision of normal first pregnancy in second trimester - Glucose Tolerance, 2 Hours w/1 Hour - CBC - HIV Antibody (routine testing w rflx) - RPR - Tdap vaccine greater than or equal to 7yo IM - discussed contraception, info given - pt  reports heartburn daily at night, discussed diet changes and sitting upright for 60 min after meals, will try Pepcid OTC daily  2. 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 [redacted] weeks gestation of pregnancy  Preterm labor symptoms and general obstetric precautions including but not limited to vaginal bleeding, contractions, leaking of fluid and fetal movement were reviewed in detail with the patient. I discussed the assessment and treatment plan with the patient. The patient was provided an opportunity to ask questions and all were answered. The patient agreed with the plan and demonstrated an understanding of the instructions. The patient was advised to call back or seek an in-person office evaluation/go to [facility] at [facility] for any urgent or concerning symptoms. Please refer to After Visit Summary for other counseling recommendations.  Return in about 2 weeks (around 09/11/2020) for in-person LOB/APP OK.   Sinda Leedom, Odie Sera, NP

## 2020-08-28 NOTE — Patient Instructions (Addendum)
Maternity Assessment Unit (MAU)  The Maternity Assessment Unit (MAU) is located at the [facility] and [place] at [facility]. The address is: [address] [place], [place], [place] [address]. Please see map below for additional directions.    The Maternity Assessment Unit is designed to help you during your pregnancy, and for up to 6 weeks after delivery, with any pregnancy- or postpartum-related emergencies, if you think you are in labor, or if your water has broken. For example, if you experience nausea and vomiting, vaginal bleeding, severe abdominal or pelvic pain, elevated blood pressure or other problems related to your pregnancy or postpartum time, please come to the Maternity Assessment Unit for assistance.       Preterm Labor The normal length of a pregnancy is 39-41 weeks. Preterm labor is when labor starts before 37 completed weeks of pregnancy. Babies who are born prematurely and survive may not be fully developed and may be at an increased risk for long-term problems such as cerebral palsy, developmental delays, and vision andhearing problems. Babies who are born too early may have problems soon after birth. Premature babies may have problems regulating blood sugar, body temperature, heart rate, and breathing rate. These babies often have trouble with feeding. The risk ofhaving problems is highest for babies who are born before 39 weeks of pregnancy. What are the causes? The exact cause of this condition is not known. What increases the risk? You are more likely to have preterm labor if you have certain risk factors that relate to your medical history, problems with present and past pregnancies, andlifestyle factors. Medical history You have abnormalities of the uterus, including a short cervix. You have STIs (sexually transmitted infections) or other infections of the urinary tract and the vagina. You have chronic illnesses, such as blood clotting  problems, diabetes, or high blood pressure. You are overweight or underweight. Present and past pregnancies You have had preterm labor before. You are pregnant with twins or other multiples. You have been diagnosed with a condition in which the placenta covers your cervix (placenta previa). You waited less than 18 months between giving birth and becoming pregnant again. Your unborn baby has some abnormalities. You have vaginal bleeding during pregnancy. You became pregnant through in vitro fertilization (IVF). Lifestyle and environmental factors You use tobacco products or drink alcohol. You use drugs. You have stress and no social support. You experience domestic violence. You are exposed to certain chemicals or environmental pollutants. Other factors You are younger than age 28 or older than age 28. What are the signs or symptoms? Symptoms of this condition include: Cramps similar to those that can happen during a menstrual period. The cramps may happen with diarrhea. Pain in the abdomen or lower back. Regular contractions that may feel like tightening of the abdomen. A feeling of increased pressure in the pelvis. Increased watery or bloody mucus discharge from the vagina. Water breaking (ruptured amniotic sac). How is this diagnosed? This condition is diagnosed based on: Your medical history and a physical exam. A pelvic exam. An ultrasound. Monitoring your uterus for contractions. Other tests, including: A swab of the cervix to check for a chemical called fetal fibronectin. Urine tests. How is this treated? Treatment for this condition depends on the length of your pregnancy, your condition, and the health of your baby. Treatment may include: Taking medicines, such as: Hormone medicines. These may be given early in pregnancy to help support the pregnancy. Medicines to stop contractions. Medicines to  help mature the baby's lungs. These may be prescribed if the risk of  delivery is high. Medicines to help protect your baby from brain and nerve complications such as cerebral palsy. Bed rest. If the labor happens before 34 weeks of pregnancy, you may need to stay in the hospital. Delivery of the baby. Follow these instructions at home:  Do not use any products that contain nicotine or tobacco. These products include cigarettes, chewing tobacco, and vaping devices, such as e-cigarettes. If you need help quitting, ask your health care provider. Do not drink alcohol. Take over-the-counter and prescription medicines only as told by your health care provider. Rest as told by your health care provider. Return to your normal activities as told by your health care provider. Ask your health care provider what activities are safe for you. Keep all follow-up visits. This is important. How is this prevented? To increase your chance of having a full-term pregnancy: Do not use drugs or take medicines that have not been prescribed to you during your pregnancy. Talk with your health care provider before taking any herbal supplements, even if you have been taking them regularly. Make sure you gain a healthy amount of weight during your pregnancy. Watch for infection. If you think that you might have an infection, get it checked right away. Symptoms of infection may include: Fever. Abnormal vaginal discharge or discharge that smells bad. Pain or burning with urination. Needing to urinate urgently. Frequently urinating or passing small amounts of urine frequently. Blood in your urine or urine that smells bad or unusual. Where to find more information U.S. Department of Health and Programmer, systems on Women's Health: [place]Signs.tn [employer]: www.acog.org Centers for Disease Control and Prevention, Preterm Birth: http://www.wolf.info/ Contact a health care provider if: You think you are going into preterm labor. You have signs  or symptoms of preterm labor. You have symptoms of infection. Get help right away if: You are having regular, painful contractions every 5 minutes or less. Your water breaks. Summary Preterm labor is labor that starts before you reach 37 weeks of pregnancy. Delivering your baby early increases your baby's risk of developing long-term problems. You are more likely to have preterm labor if you have certain risk factors that relate to your medical history, problems with present and past pregnancies, and lifestyle factors. Keep all follow-up visits. This is important. Contact a health care provider if you have signs or symptoms of preterm labor. This information is not intended to replace advice given to you by your health care provider. Make sure you discuss any questions you have with your healthcare provider. Document Revised: 02/27/2020 Document Reviewed: 02/27/2020 Elsevier Patient Education  [place].       Oral Glucose Tolerance Test During Pregnancy Why am I having this test? The oral glucose tolerance test (OGTT) is done to check how your body processes blood sugar (glucose). This is one of several tests used to diagnose diabetes that develops during pregnancy (gestational diabetes mellitus). Gestational diabetes is a short-term form of diabetes that some women develop while they are pregnant. It usually occurs during the second trimesterof pregnancy and goes away after delivery. Testing, or screening, for gestational diabetes usually occurs at weeks 24-28 of pregnancy. You may have the OGTT test after having a 1-hour glucose screening test if the results from that test indicate that you may have gestational diabetes. This test may also be needed if: You have a history of gestational  diabetes. There is a history of giving birth to very large babies or of losing pregnancies (having stillbirths). You have signs and symptoms of diabetes, such as: Changes in your  eyesight. Tingling or numbness in your hands or feet. Changes in hunger, thirst, and urination, and these are not explained by your pregnancy. What is being tested? This test measures the amount of glucose in your blood at different timesduring a period of 3 hours. This shows how well your body can process glucose. What kind of sample is taken?  Blood samples are required for this test. They are usually collected byinserting a needle into a blood vessel. How do I prepare for this test? For 3 days before your test, eat normally. Have plenty of carbohydrate-rich foods. Follow instructions from your health care provider about: Eating or drinking restrictions on the day of the test. You may be asked not to eat or drink anything other than water (to fast) starting 8-10 hours before the test. Changing or stopping your regular medicines. Some medicines may interfere with this test. Tell a health care provider about: All medicines you are taking, including vitamins, herbs, eye drops, creams, and over-the-counter medicines. Any blood disorders you have. Any surgeries you have had. Any medical conditions you have. What happens during the test? First, your blood glucose will be measured. This is referred to as your fasting blood glucose because you fasted before the test. Then, you will drink a glucose solution that contains a certain amount of glucose. Your blood glucosewill be measured again 1, 2, and 3 hours after you drink the solution. This test takes about 3 hours to complete. You will need to stay at the testing location during this time. During the testing period: Do not eat or drink anything other than the glucose solution. Do not exercise. Do not use any products that contain nicotine or tobacco, such as cigarettes, e-cigarettes, and chewing tobacco. These can affect your test results. If you need help quitting, ask your health care provider. The testing procedure may vary among health care  providers and hospitals. How are the results reported? Your results will be reported as milligrams of glucose per deciliter of blood (mg/dL) or millimoles per liter (mmol/L). There is more than one source for screening and diagnosis reference values used to diagnose gestational diabetes. Your health care provider will compare your results to normal values that were established after testing a large group of people (reference values). Reference values may vary among labs and hospitals. For this test (Carpenter-Coustan), reference values are: Fasting: 95 mg/dL (5.3 mmol/L). 1 hour: 180 mg/dL (10.0 mmol/L). 2 hour: 155 mg/dL (8.6 mmol/L). 3 hour: 140 mg/dL (7.8 mmol/L). What do the results mean? Results below the reference values are considered normal. If two or more of your blood glucose levels are at or above the reference values, you may be diagnosed with gestational diabetes. If only one level is high, your healthcare provider may suggest repeat testing or other tests to confirm a diagnosis. Talk with your health care provider about what your results mean. Questions to ask your health care provider Ask your health care provider, or the department that is doing the test: When will my results be ready? How will I get my results? What are my treatment options? What other tests do I need? What are my next steps? Summary The oral glucose tolerance test (OGTT) is one of several tests used to diagnose diabetes that develops during pregnancy (gestational diabetes mellitus). Gestational diabetes is   a short-term form of diabetes that some women develop while they are pregnant. You may have the OGTT test after having a 1-hour glucose screening test if the results from that test show that you may have gestational diabetes. You may also have this test if you have any symptoms or risk factors for this type of diabetes. Talk with your health care provider about what your results mean. This information is not  intended to replace advice given to you by your health care provider. Make sure you discuss any questions you have with your healthcare provider. Document Revised: 08/03/2019 Document Reviewed: 08/03/2019 Elsevier Patient Education  2022 [employer].       Contraception Choices Contraception, also called birth control, refers to methods or devices thatprevent pregnancy. Hormonal methods  Contraceptive implant A contraceptive implant is a thin, plastic tube that contains a hormone that prevents pregnancy. It is different from an intrauterine device (IUD). It is inserted into the upper part of the arm by a health care provider. Implants canbe effective for up to 3 years. Progestin-only injections Progestin-only injections are injections of progestin, a synthetic form of thehormone progesterone. They are given every 3 months by a health care provider. Birth control pills Birth control pills are pills that contain hormones that prevent pregnancy. They must be taken once a day, preferably at the same time each day. Aprescription is needed to use this method of contraception. Birth control patch The birth control patch contains hormones that prevent pregnancy. It is placed on the skin and must be changed once a week for three weeks and removed on thefourth week. A prescription is needed to use this method of contraception. Vaginal ring A vaginal ring contains hormones that prevent pregnancy. It is placed in the vagina for three weeks and removed on the fourth week. After that, the process is repeated with a new ring. A prescription is needed to use this method ofcontraception. Emergency contraceptive Emergency contraceptives prevent pregnancy after unprotected sex. They come in pill form and can be taken up to 5 days after sex. They work best the sooner they are taken after having sex. Most emergency contraceptives are available without a prescription. This method should not be used as your only  form ofbirth control. Barrier methods  Female condom A female condom is a thin sheath that is worn over the penis during sex. Condoms keep sperm from going inside a woman's body. They can be used with a sperm-killing substance (spermicide) to increase their effectiveness. They should be thrown away after one use. Female condom A female condom is a soft, loose-fitting sheath that is put into the vagina before sex. The condom keeps sperm from going inside a woman's body. Theyshould be thrown away after one use. Diaphragm A diaphragm is a soft, dome-shaped barrier. It is inserted into the vagina before sex, along with a spermicide. The diaphragm blocks sperm from entering the uterus, and the spermicide kills sperm. A diaphragm should be left in thevagina for 6-8 hours after sex and removed within 24 hours. A diaphragm is prescribed and fitted by a health care provider. A diaphragm should be replaced every 1-2 years, after giving birth, after gaining more than15 lb (6.8 kg), and after pelvic surgery. Cervical cap A cervical cap is a round, soft latex or plastic cup that fits over the cervix. It is inserted into the vagina before sex, along with spermicide. It blocks sperm from entering the uterus. The cap should be left in place for 6-8  hours after sex and removed within 48 hours. A cervical cap must be prescribed andfitted by a health care provider. It should be replaced every 2 years. Sponge A sponge is a soft, circular piece of polyurethane foam with spermicide in it. The sponge helps block sperm from entering the uterus, and the spermicide kills sperm. To use it, you make it wet and then insert it into the vagina. It should be inserted before sex, left in for at least 6 hours after sex, and removed andthrown away within 30 hours. Spermicides Spermicides are chemicals that kill or block sperm from entering the cervix and uterus. They can come as a cream, jelly, suppository, foam, or tablet. A spermicide  should be inserted into the vagina with an applicator at least 10-15 minutes before sex to allow time for it to work. The process must be repeatedevery time you have sex. Spermicides do not require a prescription. Intrauterine contraception Intrauterine device (IUD) An IUD is a T-shaped device that is put in a woman's uterus. There are two types: Hormone IUD.This type contains progestin, a synthetic form of the hormone progesterone. This type can stay in place for 3-5 years. Copper IUD.This type is wrapped in copper wire. It can stay in place for 10 years. Permanent methods of contraception Female tubal ligation In this method, a woman's fallopian tubes are sealed, tied, or blocked duringsurgery to prevent eggs from traveling to the uterus. Hysteroscopic sterilization In this method, a small, flexible insert is placed into each fallopian tube. The inserts cause scar tissue to form in the fallopian tubes and block them, so sperm cannot reach an egg. The procedure takes about 3 months to be effective.Another form of birth control must be used during those 3 months. Female sterilization This is a procedure to tie off the tubes that carry sperm (vasectomy). After the procedure, the man can still ejaculate fluid (semen). Another form of birth control must be used for 3 months after the procedure. Natural planning methods Natural family planning In this method, a couple does not have sex on days when the woman could become pregnant. Calendar method In this method, the woman keeps track of the length of each menstrual cycle, identifies the days when pregnancy can happen, and does not have sex on those days. Ovulation method In this method, a couple avoids sex during ovulation. Symptothermal method This method involves not having sex during ovulation. The woman typically checks for ovulation bywatching changes in her temperature and in the consistency of cervical mucus. Post-ovulation method In this  method, a couple waits to have sex until after ovulation. Where to find more information Centers for Disease Control and Prevention: FootballExhibition.com.br Summary Contraception, also called birth control, refers to methods or devices that prevent pregnancy. Hormonal methods of contraception include implants, injections, pills, patches, vaginal rings, and emergency contraceptives. Barrier methods of contraception can include female condoms, female condoms, diaphragms, cervical caps, sponges, and spermicides. There are two types of IUDs (intrauterine devices). An IUD can be put in a woman's uterus to prevent pregnancy for 3-5 years. Permanent sterilization can be done through a procedure for males and females. Natural family planning methods involve nothaving sex on days when the woman could become pregnant. This information is not intended to replace advice given to you by your health care provider. Make sure you discuss any questions you have with your healthcare provider. Document Revised: 07/31/2019 Document Reviewed: 07/31/2019 Elsevier Patient Education  2022 [employer].  Heartburn During Pregnancy Heartburn is a type of pain or discomfort that can happen in the throat or chest. It is often described as a burning sensation. Heartburn is common during pregnancy because: Progesterone, a hormone that is released during pregnancy, may relax the valve that separates the esophagus from the stomach (lower esophageal sphincter, or LES). This allows stomach acid to move up into the esophagus, causing heartburn. The uterus gets larger and pushes up on the stomach, which pushes more acid into the esophagus. This is especially true in the later stages of pregnancy. Heartburn usually goes away or gets better after giving birth. What are the causes? This condition is caused by stomach acid backing up into the esophagus (reflux). Reflux can be triggered by: Changing hormone levels. Large meals. Certain  foods and beverages, such as coffee, chocolate, onions, and peppermint. Exercise. Increased stomach acid production. What increases the risk? You are more likely to develop this condition if: You had heartburn prior to becoming pregnant. You have been pregnant more than once before. You are overweight or obese. The likelihood that you will get heartburn also increases as you get furtheralong in your pregnancy, especially during the last trimester. What are the signs or symptoms? Symptoms of this condition include: Burning pain in the chest or lower throat. A bitter taste in the mouth. Coughing. Problems swallowing. Vomiting. A hoarse voice. Asthma. Symptoms may get worse when you lie down or bend over. Symptoms are often worseat night. How is this diagnosed? This condition is diagnosed based on: Your medical history. Your symptoms. Blood tests to check for a certain type of bacteria that is associated with heartburn. Whether taking heartburn medicine relieves your symptoms. An examination of the stomach and esophagus using a tube that has a light and camera (endoscopy). How is this treated? Treatment for this condition depends on how severe your symptoms are. Your health care provider may recommend: Over-the-counter medicines for mild heartburn, such as antacids or acid reducers. Prescription medicines to decrease stomach acid or to protect your stomach lining. Certain changes in your diet. Raising the head of your bed so it is higher than the foot of the bed. This helps prevent stomach acid from backing up into the esophagus when you are lying down. Follow these instructions at home: Eating and drinking Do not drink alcohol during your pregnancy. Identify foods and beverages that make your symptoms worse and avoid them. Eat small, frequent meals instead of large meals. Avoid drinking large amounts of liquid with your meals. Avoid eating meals during the 2-3 hours before  bedtime. Avoid lying down right after you eat. Do not exercise right after you eat. Beverages to avoid Coffee and tea, with or without caffeine. Energy drinks and sports drinks. Carbonated drinks or sodas. Citrus fruit juices. Foods to avoid Chocolate and cocoa. Peppermint and mint flavorings. Garlic, onions, and horseradish. Spicy and acidic foods, including peppers, chili powder, curry powder, vinegar, hot sauces, and barbecue sauce. Citrus fruits, such as oranges, lemons, and limes. Tomato-based foods, such as red sauce, chili, and salsa. Fried and fatty foods, such as donuts, french fries, potato chips, and high-fat dressings. High-fat meats, such as hot dogs, precooked or cured meat, sausage, ham, and bacon. High-fat dairy items, such as whole milk, butter, and cheese. Medicines Take over-the-counter and prescription medicines only as told by your health care provider. Do not take aspirin or NSAIDs, such as ibuprofen, unless your health care provider tells you to take them. You may be instructed  to avoid medicines that contain sodium bicarbonate. General instructions If directed, raise the head of your bed about 6 inches (15 cm) by putting blocks under the legs. Sleeping with more pillows does not effectively relieve heartburn because it only changes the position of your head. Do not use any products that contain nicotine or tobacco, such as cigarettes, e-cigarettes, and chewing tobacco. If you need help quitting, ask your health care provider. Wear loose-fitting clothing. Try to reduce your stress, such as with yoga or meditation. If you need help managing stress, ask your health care provider. Maintain a healthy weight. If you are overweight, work with your health care provider to safely manage your weight. Keep all follow-up visits as told by your health care provider. This is important. Where to find more information American Pregnancy Association:  americanpregnancy.org Contact a health care provider if: Your symptoms do not improve with treatment, or you develop new symptoms. You have unexplained weight loss. You have difficulty swallowing. You make loud sounds when you breathe (wheeze). You have a cough that does not go away. You have frequent heartburn for more than 2 weeks. You have nausea or vomiting that does not get better with treatment. You have pain in your abdomen. Get help right away if: You have severe chest pain that spreads to your arm, neck, or jaw. You feel sweaty, dizzy, or light-headed. You have shortness of breath. You have pain when swallowing. You vomit, and your vomit looks like blood or coffee grounds. Your stool is bloody or black. Summary Heartburn in pregnancy is common, especially during the last trimester. This condition is caused by stomach acid backing up into the esophagus (reflux). This condition can be treated with medicines, changes to your diet, or elevating the head of your bed. Keep all follow-up visits as told by your health care provider. This is important. This information is not intended to replace advice given to you by your health care provider. Make sure you discuss any questions you have with your healthcare provider. Document Revised: 11/16/2018 Document Reviewed: 11/16/2018 Elsevier Patient Education  2022 Else[employer]                        Safe Medications in Pregnancy    Acne: Benzoyl Peroxide Salicylic Acid  Backache/Headache: Tylenol: 2 regular strength every 4 hours OR              2 Extra strength every 6 hours  Colds/Coughs/Allergies: Benadryl (alcohol free) 25 mg every 6 hours as needed Breath right strips Claritin Cepacol throat lozenges Chloraseptic throat spray Cold-Eeze- up to three times per day Cough drops, alcohol free Flonase (by prescription only) Guaifenesin Mucinex Robitussin DM (plain only, alcohol free) Saline nasal spray/drops Sudafed  (pseudoephedrine) & Actifed ** use only after [redacted] weeks gestation and if you do not have high blood pressure Tylenol Vicks Vaporub Zinc lozenges Zyrtec   Constipation: Colace Ducolax suppositories Fleet enema Glycerin suppositories Metamucil Milk of magnesia Miralax Senokot Smooth move tea  Diarrhea: Kaopectate Imodium A-D  *NO pepto Bismol  Hemorrhoids: Anusol Anusol HC Preparation H Tucks  Indigestion: Tums Maalox Mylanta Zantac  Pepcid*  Insomnia: Benadryl (alcohol free)  every 6 hours as needed Tylenol PM Unisom, no Gelcaps  Leg Cramps: Tums MagGel  Nausea/Vomiting:  Bonine Dramamine Emetrol Ginger extract Sea bands Meclizine  Nausea medication to take during pregnancy:  Unisom (doxylamine succinate 25 mg tablets) Take one tablet daily at bedtime. If symptoms are not adequately  controlled, the dose can be increased to a maximum recommended dose of two tablets daily (1/2 tablet in the morning, 1/2 tablet mid-afternoon and one at bedtime). Vitamin B6 100mg  tablets. Take one tablet twice a day (up to 200 mg per day).  Skin Rashes: Aveeno products Benadryl cream or 25mg  every 6 hours as needed Calamine Lotion 1% cortisone cream  Yeast infection: Gyne-lotrimin 7 Monistat 7   **If taking multiple medications, please check labels to avoid duplicating the same active ingredients **take medication as directed on the label ** Do not exceed 4000 mg of tylenol in 24 hours **Do not take medications that contain aspirin or ibuprofen         Medroxyprogesterone Injection (Contraception) What is this medication? MEDROXYPROGESTERONE (me DROX ee proe JES te rone) prevents ovulation and pregnancy. It belongs to a group of medications called contraceptives. Thismedication is a progestin hormone. This medicine may be used for other purposes; ask your health care provider orpharmacist if you have questions. COMMON BRAND NAME(S): Depo-Provera,  Depo-subQ Provera 104 What should I tell my care team before I take this medication? They need to know if you have any of these conditions: Asthma Blood clots Breast cancer or family history of breast cancer Depression Diabetes Eating disorder (anorexia nervosa) Heart attack High blood pressure HIV infection or AIDS If you often drink alcohol Kidney disease Liver disease Migraine headaches Osteoporosis, weak bones Seizures Stroke Tobacco smoker Vaginal bleeding An unusual or allergic reaction to medroxyprogesterone, other hormones, medications, foods, dyes, or preservatives Pregnant or trying to get pregnant Breast-feeding How should I use this medication? Depo-Provera CI contraceptive injection is given into a muscle. Depo-subQ Provera 104 injection is given under the skin. It is given in a hospital or clinic setting. The injection is usually given during the first 5 days afterthe start of a menstrual period or 6 weeks after delivery of a baby. A patient package insert for the product will be given with each prescription and refill. Be sure to read this information carefully each time. The sheet maychange often. Talk to your care team about the use of this medication in children. Special care may be needed. These injections have been used in female children who havestarted having menstrual periods. Overdosage: If you think you have taken too much of this medicine contact apoison control center or emergency room at once. NOTE: This medicine is only for you. Do not share this medicine with others. What if I miss a dose? Keep appointments for follow-up doses. You must get an injection once every 3 months. It is important not to miss your dose. Call your care team if you areunable to keep an appointment. What may interact with this medication? Antibiotics or medications for infections, especially rifampin and griseofulvin Antivirals for HIV or  hepatitis Aprepitant Armodafinil Bexarotene Bosentan Medications for seizures like carbamazepine, felbamate, oxcarbazepine, phenytoin, phenobarbital, primidone, topiramate Mitotane Modafinil St. John's wort This list may not describe all possible interactions. Give your health care provider a list of all the medicines, herbs, non-prescription drugs, or dietary supplements you use. Also tell them if you smoke, drink alcohol, or use illegaldrugs. Some items may interact with your medicine. What should I watch for while using this medication? This medication does not protect you against HIV infection (AIDS) or othersexually transmitted diseases. Use of this product may cause you to lose calcium from your bones. Loss of calcium may cause weak bones (osteoporosis). Only use this product for more than 2 years if other  forms of birth control are not right for you. The longer you use this product for birth control the more likely you will be at risk forweak bones. Ask your care team how you can keep strong bones. You may have a change in bleeding pattern or irregular periods. Many femalesstop having periods while taking this medication. If you have received your injections on time, your chance of being pregnant is very low. If you think you may be pregnant, see your care team as soon aspossible. Tell your care team if you want to get pregnant within the next year. The effect of this medication may last a long time after you get your lastinjection. What side effects may I notice from receiving this medication? Side effects that you should report to your care team as soon as possible: Allergic reactions-skin rash, itching, hives, swelling of the face, lips, tongue, or throat Blood clot-pain, swelling, or warmth in the leg, shortness of breath, chest pain Gallbladder problems-severe stomach pain, nausea, vomiting, fever Increase in blood pressure Liver injury-right upper belly pain, loss of appetite,  nausea, light-colored stool, dark yellow or brown urine, yellowing skin or eyes, unusual weakness or fatigue New or worsening migraines or headaches Seizures Stroke-sudden numbness or weakness of the face, arm, or leg, trouble speaking, confusion, trouble walking, loss of balance or coordination, dizziness, severe headache, change in vision Unusual vaginal discharge, itching, or odor Worsening mood, feelings of depression Side effects that usually do not require medical attention (report to your careteam if they continue or are bothersome): Breast pain or tenderness Dark patches of the skin on the face or other sun-exposed areas Irregular menstrual cycles or spotting Nausea Weight gain This list may not describe all possible side effects. Call your doctor for medical advice about side effects. You may report side effects to FDA at1-800-FDA-1088. Where should I keep my medication? This injection is only given by a care team. It will not be stored at home. NOTE: This sheet is a summary. It may not cover all possible information. If you have questions about this medicine, talk to your doctor, pharmacist, orhealth care provider.  2022 Elsevier/Gold Standard (2020-04-01 12:23:33)

## 2020-08-29 LAB — CBC
Hematocrit: 35.5 % (ref 34.0–46.6)
Hemoglobin: 11.7 g/dL (ref 11.1–15.9)
MCH: 31.9 pg (ref 26.6–33.0)
MCHC: 33 g/dL (ref 31.5–35.7)
MCV: 97 fL (ref 79–97)
Platelets: 285 10*3/uL (ref 150–450)
RBC: 3.67 x10E6/uL — ABNORMAL LOW (ref 3.77–5.28)
RDW: 13.2 % (ref 11.7–15.4)
WBC: 12.9 10*3/uL — ABNORMAL HIGH (ref 3.4–10.8)

## 2020-08-29 LAB — HIV ANTIBODY (ROUTINE TESTING W REFLEX): HIV Screen 4th Generation wRfx: NONREACTIVE

## 2020-08-29 LAB — RPR: RPR Ser Ql: NONREACTIVE

## 2020-08-29 LAB — GLUCOSE TOLERANCE, 2 HOURS W/ 1HR
Glucose, 1 hour: 142 mg/dL (ref 65–179)
Glucose, 2 hour: 119 mg/dL (ref 65–152)
Glucose, Fasting: 69 mg/dL (ref 65–91)

## 2020-09-11 ENCOUNTER — Ambulatory Visit (INDEPENDENT_AMBULATORY_CARE_PROVIDER_SITE_OTHER): Payer: Medicaid Other | Admitting: Obstetrics

## 2020-09-11 ENCOUNTER — Other Ambulatory Visit: Payer: Self-pay

## 2020-09-11 ENCOUNTER — Encounter: Payer: Self-pay | Admitting: Obstetrics

## 2020-09-11 VITALS — BP 118/78 | HR 110 | Wt 174.2 lb

## 2020-09-11 DIAGNOSIS — K5900 Constipation, unspecified: Secondary | ICD-10-CM

## 2020-09-11 DIAGNOSIS — Z3490 Encounter for supervision of normal pregnancy, unspecified, unspecified trimester: Secondary | ICD-10-CM

## 2020-09-11 DIAGNOSIS — K219 Gastro-esophageal reflux disease without esophagitis: Secondary | ICD-10-CM

## 2020-09-11 DIAGNOSIS — O99612 Diseases of the digestive system complicating pregnancy, second trimester: Secondary | ICD-10-CM

## 2020-09-11 MED ORDER — PANTOPRAZOLE SODIUM 40 MG PO TBEC: 40.0000 mg | DELAYED_RELEASE_TABLET | Freq: Every day | ORAL | 5 refills | Status: DC

## 2020-09-11 MED ORDER — DOCUSATE SODIUM 100 MG PO CAPS
100.0000 mg | ORAL_CAPSULE | Freq: Two times a day (BID) | ORAL | 5 refills | Status: DC
Start: 2020-09-11 — End: 2020-09-25

## 2020-09-11 MED ORDER — VITAFOL ULTRA 29-0.6-0.4-200 MG PO CAPS: 1.0000 | ORAL_CAPSULE | Freq: Every day | ORAL | 4 refills | Status: DC

## 2020-09-11 NOTE — Progress Notes (Signed)
Subjective:  [name] is a 30 y.o. G3P2002 at [redacted]w[redacted]d being seen today for ongoing prenatal care.  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  Patient reports backache and heartburn.  Contractions: Irritability. Vag. Bleeding: None.  Movement: Present. Denies leaking of fluid.   The following portions of the patient's history were reviewed and updated as appropriate: allergies, current medications, past family history, past medical history, past social history, past surgical history and problem list. Problem list updated.  Objective:   Vitals:   09/11/20 1010  BP: 118/78  Pulse: (!) 110  Weight: 174 lb 3.2 oz (79 kg)    Fetal Status:     Movement: Present     General:  Alert, oriented and cooperative. Patient is in no acute distress.  Skin: Skin is warm and dry. No rash noted.   Cardiovascular: Normal heart rate noted  Respiratory: Normal respiratory effort, no problems with respiration noted  Abdomen: Soft, gravid, appropriate for gestational age. Pain/Pressure: Present     Pelvic:  Cervical exam deferred        Extremities: Normal range of motion.  Edema: None  Mental Status: Normal mood and affect. Normal behavior. Normal judgment and thought content.   Urinalysis:      Assessment and Plan:  Pregnancy: G3P2002 at [redacted]w[redacted]d  1. Encounter for supervision of normal pregnancy, antepartum, unspecified gravidity Rx: - Prenat-Fe Poly-Methfol-FA-DHA (VITAFOL ULTRA) 29-0.6-0.4-200 MG CAPS; Take 1 capsule by mouth daily before breakfast.  Dispense: 90 capsule; Refill: 4  2. Constipation during pregnancy in  second trimester Rx: - docusate sodium (COLACE) 100 MG capsule; Take 1 capsule (100 mg total) by mouth 2 (two) times daily.  Dispense: 60 capsule; Refill: 5  3. GERD without esophagitis Rx: - pantoprazole (PROTONIX) 40 MG tablet; Take 1 tablet (40 mg total) by mouth daily.  Dispense: 30 tablet; Refill: 5  Preterm labor symptoms and general obstetric precautions including but not limited to vaginal bleeding, contractions, leaking of fluid and fetal movement were reviewed in detail with the patient. Please refer to After Visit Summary for other counseling recommendations.   Return in about 2 weeks (around 09/25/2020) for ROB.   Brock Bad, MD  09/11/20

## 2020-09-11 NOTE — Progress Notes (Signed)
Pt presents for ROB c/o low abdominal pain. 

## 2020-09-25 ENCOUNTER — Encounter: Payer: Self-pay | Admitting: Family Medicine

## 2020-09-25 ENCOUNTER — Other Ambulatory Visit: Payer: Self-pay

## 2020-09-25 ENCOUNTER — Ambulatory Visit (INDEPENDENT_AMBULATORY_CARE_PROVIDER_SITE_OTHER): Payer: Medicaid Other | Admitting: Family Medicine

## 2020-09-25 VITALS — BP 118/77 | HR 111 | Wt 178.0 lb

## 2020-09-25 DIAGNOSIS — Z3402 Encounter for supervision of normal first pregnancy, second trimester: Secondary | ICD-10-CM

## 2020-09-25 NOTE — Progress Notes (Signed)
   Subjective:  [name] is a 30 y.o. G3P2002 at [redacted]w[redacted]d being seen today for ongoing prenatal care.  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  Patient reports no complaints.  Contractions: Not present. Vag. Bleeding: None.  Movement: Present. Denies leaking of fluid.   The following portions of the patient's history were reviewed and updated as appropriate: allergies, current medications, past family history, past medical history, past social history, past surgical history and problem list. Problem list updated.  Objective:   Vitals:   09/25/20 0858  BP: 118/77  Pulse: (!) 111  Weight: 178 lb (80.7 kg)    Fetal Status: Fetal Heart Rate (bpm): 134   Movement: Present     General:  Alert, oriented and cooperative. Patient is in no acute distress.  Skin: Skin is warm and dry. No rash noted.   Cardiovascular: Normal heart rate noted  Respiratory: Normal respiratory effort, no problems with respiration noted  Abdomen: Soft, gravid, appropriate for gestational age. Pain/Pressure: Present     Pelvic: Vag. Bleeding: None     Cervical exam deferred        Extremities: Normal range of motion.  Edema: None  Mental Status: Normal mood and affect. Normal behavior. Normal judgment and thought content.   Urinalysis:      Assessment and Plan:  Pregnancy: G3P2002 at [redacted]w[redacted]d  1. Encounter for supervision of normal first pregnancy in second trimester BP and FHR normal Discussed post placental IUD, she would like this option at delivery  Preterm labor symptoms and general obstetric precautions including but not  limited to vaginal bleeding, contractions, leaking of fluid and fetal movement were reviewed in detail with the patient. Please refer to After Visit Summary for other counseling recommendations.  Return in 2 weeks (on 10/09/2020) for [facility], ob visit.   Venora Maples, MD

## 2020-09-25 NOTE — Progress Notes (Signed)
+   fetal movement. No complaints.  

## 2020-09-25 NOTE — Patient Instructions (Signed)

## 2020-10-09 ENCOUNTER — Encounter: Payer: Self-pay | Admitting: Obstetrics

## 2020-10-09 ENCOUNTER — Other Ambulatory Visit: Payer: Self-pay

## 2020-10-09 ENCOUNTER — Ambulatory Visit (INDEPENDENT_AMBULATORY_CARE_PROVIDER_SITE_OTHER): Payer: Medicaid Other | Admitting: Obstetrics

## 2020-10-09 VITALS — BP 120/76 | HR 95 | Wt 182.0 lb

## 2020-10-09 DIAGNOSIS — O26899 Other specified pregnancy related conditions, unspecified trimester: Secondary | ICD-10-CM

## 2020-10-09 DIAGNOSIS — Z3480 Encounter for supervision of other normal pregnancy, unspecified trimester: Secondary | ICD-10-CM

## 2020-10-09 DIAGNOSIS — R12 Heartburn: Secondary | ICD-10-CM

## 2020-10-09 MED ORDER — PANTOPRAZOLE SODIUM 40 MG PO TBEC: 40.0000 mg | DELAYED_RELEASE_TABLET | Freq: Every day | ORAL | 11 refills | Status: DC

## 2020-10-09 NOTE — Progress Notes (Signed)
Subjective:  [name] is a 30 y.o. G3P2002 at [redacted]w[redacted]d being seen today for ongoing prenatal care.  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  Patient reports heartburn.  Contractions: Not present. Vag. Bleeding: None.  Movement: Present. Denies leaking of fluid.   The following portions of the patient's history were reviewed and updated as appropriate: allergies, current medications, past family history, past medical history, past social history, past surgical history and problem list. Problem list updated.  Objective:   Vitals:   10/09/20 0953  BP: 120/76  Pulse: 95  Weight: 182 lb (82.6 kg)    Fetal Status:     Movement: Present     General:  Alert, oriented and cooperative. Patient is in no acute distress.  Skin: Skin is warm and dry. No rash noted.   Cardiovascular: Normal heart rate noted  Respiratory: Normal respiratory effort, no problems with respiration noted  Abdomen: Soft, gravid, appropriate for gestational age. Pain/Pressure: Present     Pelvic:  Cervical exam deferred        Extremities: Normal range of motion.     Mental Status: Normal mood and affect. Normal behavior. Normal judgment and thought content.   Urinalysis:      Assessment and Plan:  Pregnancy: G3P2002 at [redacted]w[redacted]d  1. Supervision of other normal pregnancy, antepartum  2. Heartburn during pregnancy, antepartum Rx: - pantoprazole (PROTONIX) 40 MG tablet; Take 1 tablet (40 mg total) by mouth daily.  Dispense: 30 tablet; Refill: 11   Preterm labor symptoms and general obstetric precautions including but not limited to vaginal  bleeding, contractions, leaking of fluid and fetal movement were reviewed in detail with the patient. Please refer to After Visit Summary for other counseling recommendations.    Brock Bad, MD  10/09/20

## 2020-10-23 ENCOUNTER — Other Ambulatory Visit: Payer: Self-pay

## 2020-10-23 ENCOUNTER — Encounter: Payer: Self-pay | Admitting: Obstetrics

## 2020-10-23 ENCOUNTER — Ambulatory Visit (INDEPENDENT_AMBULATORY_CARE_PROVIDER_SITE_OTHER): Payer: Medicaid Other | Admitting: Obstetrics

## 2020-10-23 ENCOUNTER — Other Ambulatory Visit: Payer: Self-pay | Admitting: Obstetrics

## 2020-10-23 VITALS — BP 131/79 | HR 95 | Wt 181.6 lb

## 2020-10-23 DIAGNOSIS — L700 Acne vulgaris: Secondary | ICD-10-CM

## 2020-10-23 DIAGNOSIS — Z3480 Encounter for supervision of other normal pregnancy, unspecified trimester: Secondary | ICD-10-CM

## 2020-10-23 DIAGNOSIS — R12 Heartburn: Secondary | ICD-10-CM

## 2020-10-23 DIAGNOSIS — O26899 Other specified pregnancy related conditions, unspecified trimester: Secondary | ICD-10-CM

## 2020-10-23 MED ORDER — CLINDAMYCIN PHOS-BENZOYL PEROX 1-5 % EX GEL: Freq: Two times a day (BID) | CUTANEOUS | 2 refills | Status: DC

## 2020-10-23 NOTE — Progress Notes (Signed)
Pt reports fetal movement with occasional pelvic pressure.

## 2020-10-23 NOTE — Progress Notes (Signed)
Subjective:  [name] is a 30 y.o. G3P2002 at [redacted]w[redacted]d being seen today for ongoing prenatal care.  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  Patient reports severe acne  Contractions: Not present. Vag. Bleeding: None.  Movement: Present. Denies leaking of fluid.   The following portions of the patient's history were reviewed and updated as appropriate: allergies, current medications, past family history, past medical history, past social history, past surgical history and problem list. Problem list updated.  Objective:   Vitals:   10/23/20 0958  BP: 131/79  Pulse: 95  Weight: 181 lb 9.6 oz (82.4 kg)    Fetal Status:     Movement: Present     General:  Alert, oriented and cooperative. Patient is in no acute distress.  Skin: Skin is warm and dry. No rash noted.   Cardiovascular: Normal heart rate noted  Respiratory: Normal respiratory effort, no problems with respiration noted  Abdomen: Soft, gravid, appropriate for gestational age. Pain/Pressure: Present     Pelvic:  Cervical exam deferred        Extremities: Normal range of motion.  Edema: None  Mental Status: Normal mood and affect. Normal behavior. Normal judgment and thought content.   Urinalysis:      Assessment and Plan:  Pregnancy: G3P2002 at [redacted]w[redacted]d  1. Supervision of other normal pregnancy, antepartum  2. Heartburn during pregnancy, antepartum - much improved with Protonix  3. Acne vulgaris Rx: - clindamycin-benzoyl peroxide (BENZACLIN) gel; Apply topically 2 (two) times daily.  Dispense: 50 g; Refill: 2   Preterm labor symptoms and  general obstetric precautions including but not limited to vaginal bleeding, contractions, leaking of fluid and fetal movement were reviewed in detail with the patient. Please refer to After Visit Summary for other counseling recommendations.   Return in about 1 week (around 10/30/2020) for ROB.    Brock Bad, MD 10/23/2020 10:17 AM

## 2020-10-28 ENCOUNTER — Other Ambulatory Visit: Payer: Self-pay | Admitting: Obstetrics

## 2020-10-28 DIAGNOSIS — L700 Acne vulgaris: Secondary | ICD-10-CM

## 2020-10-30 ENCOUNTER — Other Ambulatory Visit: Payer: Self-pay

## 2020-10-30 ENCOUNTER — Encounter: Payer: Self-pay | Admitting: Obstetrics

## 2020-10-30 ENCOUNTER — Other Ambulatory Visit: Payer: Self-pay | Admitting: Obstetrics

## 2020-10-30 ENCOUNTER — Ambulatory Visit (INDEPENDENT_AMBULATORY_CARE_PROVIDER_SITE_OTHER): Payer: Medicaid Other | Admitting: Obstetrics

## 2020-10-30 ENCOUNTER — Other Ambulatory Visit (HOSPITAL_COMMUNITY)
Admission: RE | Admit: 2020-10-30 | Discharge: 2020-10-30 | Disposition: A | Discharge status: Home / Self Care | Payer: Medicaid Other | Source: Ambulatory Visit | Attending: Obstetrics | Admitting: Obstetrics

## 2020-10-30 VITALS — Wt 182.9 lb

## 2020-10-30 DIAGNOSIS — Z3480 Encounter for supervision of other normal pregnancy, unspecified trimester: Secondary | ICD-10-CM | POA: Diagnosis not present

## 2020-10-30 DIAGNOSIS — L700 Acne vulgaris: Secondary | ICD-10-CM

## 2020-10-30 NOTE — Progress Notes (Signed)
Subjective:  [name] is a 30 y.o. G3P2002 at [redacted]w[redacted]d being seen today for ongoing prenatal care.  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  Patient reports no complaints.  Contractions: Not present. Vag. Bleeding: None.  Movement: Present. Denies leaking of fluid.   The following portions of the patient's history were reviewed and updated as appropriate: allergies, current medications, past family history, past medical history, past social history, past surgical history and problem list. Problem list updated.  Objective:   Vitals:   10/30/20 0942  Weight: 182 lb 14.4 oz (83 kg)    Fetal Status:     Movement: Present     General:  Alert, oriented and cooperative. Patient is in no acute distress.  Skin: Skin is warm and dry. No rash noted.   Cardiovascular: Normal heart rate noted  Respiratory: Normal respiratory effort, no problems with respiration noted  Abdomen: Soft, gravid, appropriate for gestational age. Pain/Pressure: Present     Pelvic:  Cervical exam deferred        Extremities: Normal range of motion.  Edema: None  Mental Status: Normal mood and affect. Normal behavior. Normal judgment and thought content.   Urinalysis:      Assessment and Plan:  Pregnancy: G3P2002 at [redacted]w[redacted]d  1. Supervision of other normal pregnancy, antepartum Rx: - Strep Gp B NAA - Cervicovaginal ancillary only( [place])  Preterm labor symptoms and general obstetric precautions including but not limited to vaginal bleeding, contractions, leaking of fluid and fetal movement were reviewed in detail with the  patient. Please refer to After Visit Summary for other counseling recommendations.   Return in about 1 week (around 11/06/2020) for ROB.   Brock Bad, MD  10/30/20

## 2020-10-30 NOTE — Progress Notes (Signed)
ROB 36.5wks No complaints GBS, GC/CC today.

## 2020-10-31 LAB — CERVICOVAGINAL ANCILLARY ONLY
Chlamydia: NEGATIVE
Comment: NEGATIVE
Comment: NORMAL
Neisseria Gonorrhea: NEGATIVE

## 2020-11-01 LAB — STREP GP B NAA: Strep Gp B NAA: POSITIVE — AB

## 2020-11-06 ENCOUNTER — Encounter: Payer: Self-pay | Admitting: Obstetrics

## 2020-11-06 ENCOUNTER — Other Ambulatory Visit: Payer: Self-pay

## 2020-11-06 ENCOUNTER — Ambulatory Visit (INDEPENDENT_AMBULATORY_CARE_PROVIDER_SITE_OTHER): Payer: Medicaid Other | Admitting: Obstetrics

## 2020-11-06 VITALS — BP 125/86 | HR 99 | Wt 184.0 lb

## 2020-11-06 DIAGNOSIS — N760 Acute vaginitis: Secondary | ICD-10-CM

## 2020-11-06 DIAGNOSIS — B9689 Other specified bacterial agents as the cause of diseases classified elsewhere: Secondary | ICD-10-CM

## 2020-11-06 DIAGNOSIS — Z3480 Encounter for supervision of other normal pregnancy, unspecified trimester: Secondary | ICD-10-CM

## 2020-11-06 MED ORDER — METRONIDAZOLE 500 MG PO TABS: 500.0000 mg | ORAL_TABLET | Freq: Two times a day (BID) | ORAL | 2 refills | Status: DC

## 2020-11-06 NOTE — Progress Notes (Signed)
Subjective:  [name] is a 30 y.o. G3P2002 at [redacted]w[redacted]d being seen today for ongoing prenatal care.  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  Patient reports no complaints.  Contractions: Not present. Vag. Bleeding: None.  Movement: Present. Denies leaking of fluid.   The following portions of the patient's history were reviewed and updated as appropriate: allergies, current medications, past family history, past medical history, past social history, past surgical history and problem list. Problem list updated.  Objective:   Vitals:   11/06/20 0953  BP: 125/86  Pulse: 99  Weight: 184 lb (83.5 kg)    Fetal Status:     Movement: Present     General:  Alert, oriented and cooperative. Patient is in no acute distress.  Skin: Skin is warm and dry. No rash noted.   Cardiovascular: Normal heart rate noted  Respiratory: Normal respiratory effort, no problems with respiration noted  Abdomen: Soft, gravid, appropriate for gestational age. Pain/Pressure: Present     Pelvic:  Cervical exam deferred        Extremities: Normal range of motion.     Mental Status: Normal mood and affect. Normal behavior. Normal judgment and thought content.   Urinalysis:      Assessment and Plan:  Pregnancy: G3P2002 at [redacted]w[redacted]d  1. Supervision of other normal pregnancy, antepartum   Term labor symptoms and general obstetric precautions including but not limited to vaginal bleeding, contractions, leaking of fluid and fetal movement were reviewed in detail with the patient. Please refer to After Visit Summary for other counseling  recommendations.   Return in about 1 week (around 11/13/2020) for ROB.   Brock Bad, MD  11/06/20

## 2020-11-08 ENCOUNTER — Telehealth (HOSPITAL_COMMUNITY): Payer: Self-pay | Admitting: *Deleted

## 2020-11-08 NOTE — Telephone Encounter (Signed)
Preadmission screen  

## 2020-11-09 ENCOUNTER — Other Ambulatory Visit: Payer: Self-pay | Admitting: Women's Health

## 2020-11-11 ENCOUNTER — Other Ambulatory Visit: Payer: Self-pay | Admitting: Advanced Practice Midwife

## 2020-11-12 ENCOUNTER — Telehealth (HOSPITAL_COMMUNITY): Payer: Self-pay | Admitting: *Deleted

## 2020-11-12 NOTE — Telephone Encounter (Signed)
Preadmission screen  

## 2020-11-13 ENCOUNTER — Ambulatory Visit (INDEPENDENT_AMBULATORY_CARE_PROVIDER_SITE_OTHER): Payer: Medicaid Other | Admitting: Obstetrics

## 2020-11-13 ENCOUNTER — Other Ambulatory Visit: Payer: Self-pay | Admitting: Obstetrics & Gynecology

## 2020-11-13 ENCOUNTER — Telehealth (HOSPITAL_COMMUNITY): Payer: Self-pay | Admitting: *Deleted

## 2020-11-13 ENCOUNTER — Encounter: Payer: Self-pay | Admitting: Obstetrics

## 2020-11-13 ENCOUNTER — Other Ambulatory Visit: Payer: Self-pay

## 2020-11-13 ENCOUNTER — Encounter (HOSPITAL_COMMUNITY): Payer: Self-pay | Admitting: *Deleted

## 2020-11-13 VITALS — BP 125/84 | HR 88 | Wt 185.0 lb

## 2020-11-13 DIAGNOSIS — Z3480 Encounter for supervision of other normal pregnancy, unspecified trimester: Secondary | ICD-10-CM

## 2020-11-13 LAB — SARS CORONAVIRUS 2 (TAT 6-24 HRS): SARS Coronavirus 2: NEGATIVE

## 2020-11-13 NOTE — Progress Notes (Signed)
Subjective:  [name] is a 30 y.o. G3P2002 at [redacted]w[redacted]d being seen today for ongoing prenatal care.  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  Patient reports occasional contractions and pelvic pressure .  Contractions: Not present. Vag. Bleeding: None.  Movement: Present. Denies leaking of fluid.   The following portions of the patient's history were reviewed and updated as appropriate: allergies, current medications, past family history, past medical history, past social history, past surgical history and problem list. Problem list updated.  Objective:   Vitals:   11/13/20 1418  BP: 125/84  Pulse: 88  Weight: 185 lb (83.9 kg)    Fetal Status:     Movement: Present     General:  Alert, oriented and cooperative. Patient is in no acute distress.  Skin: Skin is warm and dry. No rash noted.   Cardiovascular: Normal heart rate noted  Respiratory: Normal respiratory effort, no problems with respiration noted  Abdomen: Soft, gravid, appropriate for gestational age. Pain/Pressure: Present     Pelvic:  Cervical exam deferred        Extremities: Normal range of motion.  Edema: None  Mental Status: Normal mood and affect. Normal behavior. Normal judgment and thought content.   Urinalysis:      Assessment and Plan:  Pregnancy: G3P2002 at [redacted]w[redacted]d  1. Supervision of other normal pregnancy, antepartum   Term labor symptoms and general obstetric precautions including but not limited to vaginal bleeding, contractions, leaking of fluid and fetal movement were reviewed in detail with the patient. Please refer to After  Visit Summary for other counseling recommendations.   Return in about 1 week (around 11/20/2020) for ROB.   Brock Bad, MD  11/13/20

## 2020-11-13 NOTE — Telephone Encounter (Signed)
Preadmission screen  

## 2020-11-15 ENCOUNTER — Inpatient Hospital Stay (HOSPITAL_COMMUNITY): Payer: Medicaid Other

## 2020-11-16 ENCOUNTER — Other Ambulatory Visit: Payer: Self-pay

## 2020-11-16 ENCOUNTER — Encounter (HOSPITAL_COMMUNITY): Payer: Self-pay | Admitting: Obstetrics & Gynecology

## 2020-11-16 ENCOUNTER — Inpatient Hospital Stay (HOSPITAL_COMMUNITY)
Admission: AD | Admit: 2020-11-16 | Discharge: 2020-11-19 | DRG: 806 | Disposition: A | Discharge status: Home / Self Care | Payer: Medicaid Other | Attending: Family Medicine | Admitting: Family Medicine

## 2020-11-16 DIAGNOSIS — Z141 Cystic fibrosis carrier: Secondary | ICD-10-CM | POA: Diagnosis not present

## 2020-11-16 DIAGNOSIS — Z3A39 39 weeks gestation of pregnancy: Secondary | ICD-10-CM | POA: Diagnosis not present

## 2020-11-16 DIAGNOSIS — D62 Acute posthemorrhagic anemia: Secondary | ICD-10-CM | POA: Diagnosis not present

## 2020-11-16 DIAGNOSIS — Z3402 Encounter for supervision of normal first pregnancy, second trimester: Secondary | ICD-10-CM

## 2020-11-16 DIAGNOSIS — O9081 Anemia of the puerperium: Secondary | ICD-10-CM | POA: Diagnosis not present

## 2020-11-16 DIAGNOSIS — Z3490 Encounter for supervision of normal pregnancy, unspecified, unspecified trimester: Secondary | ICD-10-CM | POA: Diagnosis present

## 2020-11-16 DIAGNOSIS — O99824 Streptococcus B carrier state complicating childbirth: Secondary | ICD-10-CM | POA: Diagnosis present

## 2020-11-16 DIAGNOSIS — O26893 Other specified pregnancy related conditions, third trimester: Secondary | ICD-10-CM | POA: Diagnosis present

## 2020-11-16 DIAGNOSIS — O4593 Premature separation of placenta, unspecified, third trimester: Principal | ICD-10-CM | POA: Diagnosis present

## 2020-11-16 LAB — CBC
HCT: 33.5 % — ABNORMAL LOW (ref 36.0–46.0)
Hemoglobin: 10.8 g/dL — ABNORMAL LOW (ref 12.0–15.0)
MCH: 30 pg (ref 26.0–34.0)
MCHC: 32.2 g/dL (ref 30.0–36.0)
MCV: 93.1 fL (ref 80.0–100.0)
Platelets: 260 10*3/uL (ref 150–400)
RBC: 3.6 MIL/uL — ABNORMAL LOW (ref 3.87–5.11)
RDW: 14.2 % (ref 11.5–15.5)
WBC: 7 10*3/uL (ref 4.0–10.5)
nRBC: 0 % (ref 0.0–0.2)

## 2020-11-16 MED ORDER — LACTATED RINGERS IV SOLN: INTRAVENOUS | Status: DC

## 2020-11-16 MED ORDER — SOD CITRATE-CITRIC ACID 500-334 MG/5ML PO SOLN
30.0000 mL | ORAL | Status: DC | PRN
Start: 2020-11-16 — End: 2020-11-17

## 2020-11-16 MED ORDER — OXYTOCIN-SODIUM CHLORIDE 30-0.9 UT/500ML-% IV SOLN
2.5000 [IU]/h | INTRAVENOUS | Status: DC
Start: 2020-11-16 — End: 2020-11-17
  Administered 2020-11-17: 2.5 [IU]/h via INTRAVENOUS
  Filled 2020-11-16 (×2): qty 500

## 2020-11-16 MED ORDER — PENICILLIN G POT IN DEXTROSE 60000 UNIT/ML IV SOLN
3.0000 10*6.[IU] | INTRAVENOUS | Status: DC
  Administered 2020-11-16 – 2020-11-17 (×3): 3 10*6.[IU] via INTRAVENOUS
  Filled 2020-11-16 (×3): qty 50

## 2020-11-16 MED ORDER — OXYTOCIN BOLUS FROM INFUSION
333.0000 mL | Freq: Once | INTRAVENOUS | Status: AC
Start: 2020-11-16 — End: 2020-11-17
  Administered 2020-11-17: 333 mL via INTRAVENOUS

## 2020-11-16 MED ORDER — ACETAMINOPHEN 325 MG PO TABS
650.0000 mg | ORAL_TABLET | ORAL | Status: DC | PRN
  Administered 2020-11-17: 650 mg via ORAL
  Filled 2020-11-16: qty 2

## 2020-11-16 MED ORDER — LACTATED RINGERS IV SOLN: 500.0000 mL | INTRAVENOUS | Status: DC | PRN

## 2020-11-16 MED ORDER — SODIUM CHLORIDE 0.9 % IV SOLN
5.0000 10*6.[IU] | Freq: Once | INTRAVENOUS | Status: AC
Start: 2020-11-16 — End: 2020-11-16
  Administered 2020-11-16: 5 10*6.[IU] via INTRAVENOUS
  Filled 2020-11-16: qty 5

## 2020-11-16 MED ORDER — LIDOCAINE HCL (PF) 1 % IJ SOLN: 30.0000 mL | INTRAMUSCULAR | Status: DC | PRN

## 2020-11-16 MED ORDER — OXYCODONE-ACETAMINOPHEN 5-325 MG PO TABS
1.0000 | ORAL_TABLET | ORAL | Status: DC | PRN
Start: 2020-11-16 — End: 2020-11-17

## 2020-11-16 MED ORDER — MISOPROSTOL 50MCG HALF TABLET
50.0000 ug | ORAL_TABLET | ORAL | Status: DC | PRN
  Administered 2020-11-16 (×2): 50 ug via BUCCAL
  Filled 2020-11-16 (×2): qty 1

## 2020-11-16 MED ORDER — MISOPROSTOL 25 MCG QUARTER TABLET: 25.0000 ug | ORAL_TABLET | ORAL | Status: DC | PRN

## 2020-11-16 MED ORDER — OXYTOCIN-SODIUM CHLORIDE 30-0.9 UT/500ML-% IV SOLN: 1.0000 m[IU]/min | INTRAVENOUS | Status: DC

## 2020-11-16 MED ORDER — FENTANYL CITRATE (PF) 100 MCG/2ML IJ SOLN
100.0000 ug | INTRAMUSCULAR | Status: DC | PRN
  Administered 2020-11-16 – 2020-11-17 (×5): 100 ug via INTRAVENOUS
  Filled 2020-11-16 (×5): qty 2

## 2020-11-16 MED ORDER — TERBUTALINE SULFATE 1 MG/ML IJ SOLN: 0.2500 mg | Freq: Once | INTRAMUSCULAR | Status: DC | PRN

## 2020-11-16 MED ORDER — OXYCODONE-ACETAMINOPHEN 5-325 MG PO TABS: 2.0000 | ORAL_TABLET | ORAL | Status: DC | PRN

## 2020-11-16 MED ORDER — ONDANSETRON HCL 4 MG/2ML IJ SOLN
4.0000 mg | Freq: Four times a day (QID) | INTRAMUSCULAR | Status: DC | PRN
  Administered 2020-11-17: 4 mg via INTRAVENOUS
  Filled 2020-11-16: qty 2

## 2020-11-16 NOTE — H&P (Signed)
OBSTETRIC ADMISSION HISTORY AND PHYSICAL  [name] is a 30 y.o. female G3P2002 with IUP at 1w1dby [place] for elective IOL. She reports +FMs, No LOF, no VB, no blurry vision, headaches or peripheral edema, and RUQ pain.  She plans on breast and bottle feeding. She request Depoprovera for birth control. She received her prenatal care [facility] --->  Estimated Date of Delivery: 11/22/20  Sono:   '@[redacted]w[redacted]d' , CWD, normal anatomy, cephalic presentation, posterior placental lie, 282g, 52% EFW   Prenatal History/Complications:  GBS positive>PCN  Past Medical History: Past Medical History:  Diagnosis Date   Allergy    Asthma    Vaginal Pap smear, abnormal    +HPV, LSIL    Past Surgical History: Past Surgical History:  Procedure Laterality Date   WISDOM TOOTH EXTRACTION Bilateral 2010    Obstetrical History: OB History     Gravida  3   Para  2   Term  2   Preterm  0   AB  0   Living  2      SAB  0   IAB  0   Ectopic  0   Multiple  0   Live Births  2           Social History Social History   Socioeconomic History   Marital status: Single    Spouse name: Not on file   Number of children: 1   Years of education: Not on file   Highest education level: Not on file  Occupational History   Not on file  Tobacco Use   Smoking status: Never   Smokeless tobacco: Never  Vaping Use   Vaping Use: Never used  Substance and Sexual Activity   Alcohol use: No   Drug use: No   Sexual activity: Yes    Partners: Male    Birth control/protection: None  Other Topics Concern   Not on file  Social History Narrative   Not on file   Social Determinants of Health   Financial Resource Strain: Not on file  Food Insecurity: Not on file  Transportation Needs: Not on file  Physical Activity: Not on file  Stress: Not on file  Social Connections: Not on file    Family History: Family History  Problem Relation Age of Onset    COPD Mother    Hypertension Father    Seizures Brother    Diabetes Paternal Grandmother    Hypertension Paternal Grandmother    Diabetes Paternal Grandfather    Hypertension Paternal Grandfather    Diabetes Paternal Aunt    Diabetes Paternal Uncle    Cancer Neg Hx     Allergies: Allergies  Allergen Reactions   Banana Itching and Swelling   Orange Fruit [Citrus] Itching and Swelling    Medications Prior to Admission  Medication Sig Dispense Refill Last Dose   Blood Pressure Monitoring (BLOOD PRESSURE KIT) DEVI 1 kit by Does not apply route once a week. 1 each 0    Clindamycin Phos-Benzoyl Perox gel APPLY TO AFFECTED AREA TWICE A DAY (Patient not taking: Reported on 11/13/2020) 50 g 4    metroNIDAZOLE (FLAGYL) 500 MG tablet Take 1 tablet (500 mg total) by mouth 2 (two) times daily. (Patient not taking: Reported on 11/13/2020) 14 tablet 2    pantoprazole (PROTONIX) 40 MG tablet Take 1 tablet (40 mg total) by mouth daily. (Patient not taking: Reported on 11/13/2020) 30 tablet 5  pantoprazole (PROTONIX) 40 MG tablet Take 1 tablet (40 mg total) by mouth daily. 30 tablet 11    Prenat-Fe Poly-Methfol-FA-DHA (VITAFOL ULTRA) 29-0.6-0.4-200 MG CAPS Take 1 capsule by mouth daily before breakfast. 90 capsule 4      Review of Systems   All systems reviewed and negative except as stated in HPI  Blood pressure 140/88, pulse 95, temperature 99.7 F (37.6 C), temperature source Oral, resp. rate 17, height '5\' 5"'  (1.651 m), weight 85.4 kg, last menstrual period 02/16/2020, not currently breastfeeding. General appearance: alert Lungs: clear to auscultation bilaterally Heart: regular rate and rhythm Abdomen: soft, non-tender; bowel sounds normal, EFW 3400 by leos Extremities: Homans sign is negative, no sign of DVT Presentation: cephalic Fetal monitoringBaseline: 140 bpm, Variability: Good {> 6 bpm), Accelerations: Reactive, and Decelerations: Absent Uterine activity: irregular   Cervix:  1/30/-3  Prenatal labs: ABO, Rh: --/--/PENDING (09/10 1730) Antibody: PENDING (09/10 1730) Rubella: 3.35 (03/02 0949) RPR: Non Reactive (06/22 1142)  HBsAg: Negative (03/02 0949)  HIV: Non Reactive (06/22 1142)  GBS: Positive/-- (08/24 1017)  2 hr Glucola normal Genetic screening  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, LR NIPS Anatomy US normal  Prenatal Transfer Tool  Maternal Diabetes: No Genetic Screening: Normal Maternal Ultrasounds/Referrals: Normal Fetal Ultrasounds or other Referrals:  None Maternal Substance Abuse:  No Significant Maternal Medications:  None Significant Maternal Lab Results: Group B Strep positive  Results for orders placed or performed during the hospital encounter of 11/16/20 (from the past 24 hour(s))  CBC   Collection Time: 11/16/20  5:30 PM  Result Value Ref Range   WBC 7.0 4.0 - 10.5 K/uL   RBC 3.60 (L) 3.87 - 5.11 MIL/uL   Hemoglobin 10.8 (L) 12.0 - 15.0 g/dL   HCT 33.5 (L) 36.0 - 46.0 %   MCV 93.1 80.0 - 100.0 fL   MCH 30.0 26.0 - 34.0 pg   MCHC 32.2 30.0 - 36.0 g/dL   RDW 14.2 11.5 - 15.5 %   Platelets 260 150 - 400 K/uL   nRBC 0.0 0.0 - 0.2 %  Type and screen   Collection Time: 11/16/20  5:30 PM  Result Value Ref Range   ABO/RH(D) PENDING    Antibody Screen PENDING    Sample Expiration      11/19/2020,2359 Performed at [facility], 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 G3P2002 at 71w1dhere for  elective IOL  #Labor: cervix is 1cm on exam, Cooks balloon placed with speculum exam. Will plan for cytotec when not contracting too much #Pain: Plans epidural #FWB: Cat I #ID:  GBS +> PCN #MOF: both #MOC:Depo #Circ:  Yes  ARenard Matter MD  11/16/2020, 6:32 PM

## 2020-11-17 ENCOUNTER — Encounter (HOSPITAL_COMMUNITY): Payer: Self-pay | Admitting: Obstetrics & Gynecology

## 2020-11-17 ENCOUNTER — Inpatient Hospital Stay (HOSPITAL_COMMUNITY): Payer: Medicaid Other | Admitting: Anesthesiology

## 2020-11-17 DIAGNOSIS — O99824 Streptococcus B carrier state complicating childbirth: Secondary | ICD-10-CM | POA: Diagnosis not present

## 2020-11-17 DIAGNOSIS — Z3A39 39 weeks gestation of pregnancy: Secondary | ICD-10-CM | POA: Diagnosis not present

## 2020-11-17 LAB — RPR: RPR Ser Ql: NONREACTIVE

## 2020-11-17 LAB — CBC
HCT: 28.6 % — ABNORMAL LOW (ref 36.0–46.0)
Hemoglobin: 9.1 g/dL — ABNORMAL LOW (ref 12.0–15.0)
MCH: 29.5 pg (ref 26.0–34.0)
MCHC: 31.8 g/dL (ref 30.0–36.0)
MCV: 92.9 fL (ref 80.0–100.0)
Platelets: 219 10*3/uL (ref 150–400)
RBC: 3.08 MIL/uL — ABNORMAL LOW (ref 3.87–5.11)
RDW: 14.3 % (ref 11.5–15.5)
WBC: 22.6 10*3/uL — ABNORMAL HIGH (ref 4.0–10.5)
nRBC: 0 % (ref 0.0–0.2)

## 2020-11-17 MED ORDER — FENTANYL-BUPIVACAINE-NACL 0.5-0.125-0.9 MG/250ML-% EP SOLN
12.0000 mL/h | EPIDURAL | Status: DC | PRN
  Administered 2020-11-17: 12 mL/h via EPIDURAL

## 2020-11-17 MED ORDER — MISOPROSTOL 200 MCG PO TABS
ORAL_TABLET | ORAL | Status: AC
  Filled 2020-11-17: qty 5

## 2020-11-17 MED ORDER — LIDOCAINE HCL (PF) 1 % IJ SOLN
INTRAMUSCULAR | Status: DC | PRN
  Administered 2020-11-17: 8 mL via EPIDURAL

## 2020-11-17 MED ORDER — ZOLPIDEM TARTRATE 5 MG PO TABS: 5.0000 mg | ORAL_TABLET | Freq: Every evening | ORAL | Status: DC | PRN

## 2020-11-17 MED ORDER — EPHEDRINE 5 MG/ML INJ: 10.0000 mg | INTRAVENOUS | Status: DC | PRN

## 2020-11-17 MED ORDER — ACETAMINOPHEN 325 MG PO TABS
650.0000 mg | ORAL_TABLET | ORAL | Status: DC | PRN
  Administered 2020-11-17 – 2020-11-18 (×4): 650 mg via ORAL
  Filled 2020-11-17 (×4): qty 2

## 2020-11-17 MED ORDER — MISOPROSTOL 200 MCG PO TABS
1000.0000 ug | ORAL_TABLET | Freq: Once | ORAL | Status: AC
  Administered 2020-11-17: 1000 ug via RECTAL

## 2020-11-17 MED ORDER — ONDANSETRON HCL 4 MG PO TABS: 4.0000 mg | ORAL_TABLET | ORAL | Status: DC | PRN

## 2020-11-17 MED ORDER — FENTANYL-BUPIVACAINE-NACL 0.5-0.125-0.9 MG/250ML-% EP SOLN: 12.0000 mL/h | EPIDURAL | Status: DC | PRN

## 2020-11-17 MED ORDER — IBUPROFEN 600 MG PO TABS
600.0000 mg | ORAL_TABLET | Freq: Four times a day (QID) | ORAL | Status: DC
  Administered 2020-11-17 – 2020-11-19 (×8): 600 mg via ORAL
  Filled 2020-11-17 (×8): qty 1

## 2020-11-17 MED ORDER — DIBUCAINE (PERIANAL) 1 % EX OINT: 1.0000 "application " | TOPICAL_OINTMENT | CUTANEOUS | Status: DC | PRN

## 2020-11-17 MED ORDER — METHYLERGONOVINE MALEATE 0.2 MG/ML IJ SOLN
INTRAMUSCULAR | Status: AC
  Filled 2020-11-17: qty 1

## 2020-11-17 MED ORDER — COCONUT OIL OIL: 1.0000 "application " | TOPICAL_OIL | Status: DC | PRN

## 2020-11-17 MED ORDER — WITCH HAZEL-GLYCERIN EX PADS: 1.0000 "application " | MEDICATED_PAD | CUTANEOUS | Status: DC | PRN

## 2020-11-17 MED ORDER — DIPHENHYDRAMINE HCL 25 MG PO CAPS: 25.0000 mg | ORAL_CAPSULE | Freq: Four times a day (QID) | ORAL | Status: DC | PRN

## 2020-11-17 MED ORDER — ONDANSETRON HCL 4 MG/2ML IJ SOLN: 4.0000 mg | INTRAMUSCULAR | Status: DC | PRN

## 2020-11-17 MED ORDER — SIMETHICONE 80 MG PO CHEW: 80.0000 mg | CHEWABLE_TABLET | ORAL | Status: DC | PRN

## 2020-11-17 MED ORDER — METHYLERGONOVINE MALEATE 0.2 MG/ML IJ SOLN
0.2000 mg | Freq: Once | INTRAMUSCULAR | Status: AC
  Administered 2020-11-17: 0.2 mg via INTRAMUSCULAR

## 2020-11-17 MED ORDER — PHENYLEPHRINE 40 MCG/ML (10ML) SYRINGE FOR IV PUSH (FOR BLOOD PRESSURE SUPPORT)
80.0000 ug | PREFILLED_SYRINGE | INTRAVENOUS | Status: DC | PRN
  Administered 2020-11-17: 80 ug via INTRAVENOUS

## 2020-11-17 MED ORDER — SODIUM CHLORIDE 0.9 % IV SOLN
3.0000 g | Freq: Once | INTRAVENOUS | Status: AC
  Administered 2020-11-17: 3 g via INTRAVENOUS
  Filled 2020-11-17: qty 8

## 2020-11-17 MED ORDER — OXYTOCIN-SODIUM CHLORIDE 30-0.9 UT/500ML-% IV SOLN
1.0000 m[IU]/min | INTRAVENOUS | Status: DC
Start: 2020-11-17 — End: 2020-11-17
  Administered 2020-11-17: 2 m[IU]/min via INTRAVENOUS

## 2020-11-17 MED ORDER — LACTATED RINGERS IV SOLN: INTRAVENOUS | Status: DC

## 2020-11-17 MED ORDER — BENZOCAINE-MENTHOL 20-0.5 % EX AERO: 1.0000 "application " | INHALATION_SPRAY | CUTANEOUS | Status: DC | PRN

## 2020-11-17 MED ORDER — FENTANYL-BUPIVACAINE-NACL 0.5-0.125-0.9 MG/250ML-% EP SOLN
EPIDURAL | Status: AC
  Filled 2020-11-17: qty 250

## 2020-11-17 MED ORDER — SENNOSIDES-DOCUSATE SODIUM 8.6-50 MG PO TABS
2.0000 | ORAL_TABLET | ORAL | Status: DC
  Administered 2020-11-18: 2 via ORAL
  Filled 2020-11-17: qty 2

## 2020-11-17 MED ORDER — DIPHENHYDRAMINE HCL 50 MG/ML IJ SOLN
12.5000 mg | INTRAMUSCULAR | Status: DC | PRN
Start: 2020-11-17 — End: 2020-11-17

## 2020-11-17 MED ORDER — PRENATAL MULTIVITAMIN CH
1.0000 | ORAL_TABLET | Freq: Every day | ORAL | Status: DC
  Administered 2020-11-19: 1 via ORAL
  Filled 2020-11-17: qty 1

## 2020-11-17 MED ORDER — TRANEXAMIC ACID-NACL 1000-0.7 MG/100ML-% IV SOLN
INTRAVENOUS | Status: AC
  Administered 2020-11-17: 1000 mg
  Filled 2020-11-17: qty 100

## 2020-11-17 MED ORDER — TETANUS-DIPHTH-ACELL PERTUSSIS 5-2.5-18.5 LF-MCG/0.5 IM SUSY: 0.5000 mL | PREFILLED_SYRINGE | Freq: Once | INTRAMUSCULAR | Status: DC

## 2020-11-17 MED ORDER — LACTATED RINGERS IV SOLN
500.0000 mL | Freq: Once | INTRAVENOUS | Status: AC
  Administered 2020-11-17: 500 mL via INTRAVENOUS

## 2020-11-17 MED ORDER — LACTATED RINGERS IV BOLUS: 1000.0000 mL | Freq: Once | INTRAVENOUS | Status: DC

## 2020-11-17 MED ORDER — SODIUM CHLORIDE 0.9 % IV SOLN
500.0000 mg | Freq: Once | INTRAVENOUS | Status: AC
  Administered 2020-11-17: 500 mg via INTRAVENOUS
  Filled 2020-11-17: qty 25

## 2020-11-17 MED ORDER — PHENYLEPHRINE 40 MCG/ML (10ML) SYRINGE FOR IV PUSH (FOR BLOOD PRESSURE SUPPORT)
80.0000 ug | PREFILLED_SYRINGE | INTRAVENOUS | Status: DC | PRN
  Filled 2020-11-17: qty 10

## 2020-11-17 NOTE — Progress Notes (Signed)
Epidural to remain in per Dr. Malen Gauze upon transfer to [facility] due to pt [facility]

## 2020-11-17 NOTE — Anesthesia Preprocedure Evaluation (Signed)
Anesthesia Evaluation  Patient identified by MRN, date of birth, ID band Patient awake    Reviewed: Allergy & Precautions, H&P , NPO status , Patient's Chart, lab work & pertinent test results, reviewed documented beta blocker date and time   Airway Mallampati: II  TM Distance: >3 FB Neck ROM: full    Dental no notable dental hx. (+) Teeth Intact, Dental Advisory Given   Pulmonary neg pulmonary ROS,    Pulmonary exam normal breath sounds clear to auscultation       Cardiovascular negative cardio ROS Normal cardiovascular exam Rhythm:regular Rate:Normal     Neuro/Psych negative neurological ROS  negative psych ROS   GI/Hepatic negative GI ROS, Neg liver ROS,   Endo/Other  negative endocrine ROS  Renal/GU negative Renal ROS  negative genitourinary   Musculoskeletal   Abdominal   Peds  Hematology negative hematology ROS (+)   Anesthesia Other Findings   Reproductive/Obstetrics (+) Pregnancy                             Anesthesia Physical Anesthesia Plan  ASA: 2  Anesthesia Plan: Epidural   Post-op Pain Management:    Induction:   PONV Risk Score and Plan: 2  Airway Management Planned: Natural Airway  Additional Equipment: None  Intra-op Plan:   Post-operative Plan:   Informed Consent: I have reviewed the patients History and Physical, chart, labs and discussed the procedure including the risks, benefits and alternatives for the proposed anesthesia with the patient or authorized representative who has indicated his/her understanding and acceptance.       Plan Discussed with: Anesthesiologist  Anesthesia Plan Comments:         Anesthesia Quick Evaluation

## 2020-11-17 NOTE — Progress Notes (Signed)
Patient Vitals for the past 4 hrs:  BP Temp Temp src Pulse SpO2  11/17/20 1535 -- -- -- -- 100 %  11/17/20 1531 130/76 -- -- 100 --  11/17/20 1525 -- -- -- -- 99 %  11/17/20 1520 -- -- -- -- 100 %  11/17/20 1515 (!) 124/92 -- -- (!) 116 100 %  11/17/20 1510 -- -- -- -- 99 %  11/17/20 1505 -- -- -- -- 99 %  11/17/20 1501 123/72 -- -- 95 --  11/17/20 1455 -- -- -- -- 100 %  11/17/20 1450 -- -- -- -- 100 %  11/17/20 1446 123/88 -- -- (!) 121 --  11/17/20 1440 -- -- -- -- 100 %  11/17/20 1435 (!) 146/77 -- -- 100 99 %  11/17/20 1425 -- -- -- -- 100 %  11/17/20 1420 -- -- -- (!) 106 97 %  11/17/20 1416 111/75 -- -- (!) 153 --  11/17/20 1401 105/73 -- -- (!) 152 --  11/17/20 1348 (!) 115/52 -- -- (!) 132 --  11/17/20 1340 -- -- -- -- 100 %  11/17/20 1335 -- -- -- -- 100 %  11/17/20 1325 -- -- -- -- 100 %  11/17/20 1320 -- -- -- -- 100 %  11/17/20 1315 -- -- -- -- 100 %  11/17/20 1301 133/87 -- -- (!) 134 --  11/17/20 1231 124/72 -- -- 81 --  11/17/20 1201 119/74 98 F (36.7 C) Oral 89 --   Pt had multiple bouts of uterine antony w/clots.  LUS evaucated, methergine given IM. 20 minutes later, passed another 300+cc clots.  Dr. Shawnie Pons in room, LUS cleared of a few more clots, Jada inserted per manufacture's instructions, balloon inflated w/80cc sterile water, put to wall suction @ .  Will leave in for 1-2 hours.  Foley catheter placed as well.

## 2020-11-17 NOTE — Progress Notes (Signed)
Spoke with Dr. Nobie Putnam regarding patients recent fundal check which was firm and small in regards to bleeding but did have a trickle which stopped after a few fundal rubs. Patient does not have a new order for pitocin and MD states to turn pitocin off at this time as well as to remove the foley catheter and epidural and to resume normal postpartum care. MD is okay if patient eats at this time. Will continue to monitor.

## 2020-11-17 NOTE — Progress Notes (Signed)
Minimal blood in suction tube.  Jada removed, bleeding scant, FF.  WIll leave foley in a bit longer, give ppx abx for manual exploration of uterus, give venofer.

## 2020-11-17 NOTE — Anesthesia Procedure Notes (Signed)
Epidural Patient location during procedure: OB Start time: 11/17/2020 6:22 AM End time: 11/17/2020 6:28 AM  Staffing Anesthesiologist: Bethena Midget, MD  Preanesthetic Checklist Completed: patient identified, IV checked, site marked, risks and benefits discussed, surgical consent, monitors and equipment checked, pre-op evaluation and timeout performed  Epidural Patient position: sitting Prep: DuraPrep and site prepped and draped Patient monitoring: continuous pulse ox and blood pressure Approach: midline Location: L3-L4 Injection technique: LOR air  Needle:  Needle type: Tuohy  Needle gauge: 17 G Needle length: 9 cm and 9 Needle insertion depth: 6 cm Catheter type: closed end flexible Catheter size: 19 Gauge Catheter at skin depth: 12 cm Test dose: negative  Assessment Events: blood not aspirated, injection not painful, no injection resistance, no paresthesia and negative IV test

## 2020-11-17 NOTE — Lactation Note (Signed)
This note was copied from a baby's chart. Lactation Consultation Note  Patient Name: [name] Date: 11/17/2020 Reason for consult: Initial assessment;Mother's request;Term Age:28 hours  LC in to room for initial visit. Mother states baby is sleepy and uninterested to eat. Offered assistance with latch, mother declines at this time. Infant has been getting formula since delivery due to [facility]. Talked about volume to feed, pacing, upright position and burping. "Rosalyn Gess" took ~20mL via bottle.   Reviewed newborn behavior, feeding patterns and expectations with mother. Set up DEBP and reinforced frequency/use/care and milk storage.   Plan:   1-Feeding on demand 8-12 times in 24h following guidelines 2-Hand pump or DEBP for stimulation and supplementation purposes every 3h. 4-Encouraged maternal hydration, nutrition and rest.  Contact LC as needed for feeds/support/concerns/questions. All questions answered at this time. LC brochure and InJoy booklet reviewed.   Maternal Data Has patient been taught Hand Expression?: Yes Does the patient have breastfeeding experience prior to this delivery?: Yes How long did the patient breastfeed?: 1 month x 2  Feeding Mother's Current Feeding Choice: Breast Milk and Formula Nipple Type: Extra Slow Flow  Lactation Tools Discussed/Used Tools: Pump;Flanges Flange Size: 24 Breast pump type: Double-Electric Breast Pump;Manual Pump Education: Setup, frequency, and cleaning;Milk Storage Reason for Pumping: PPH, mother's request Pumping frequency: Q3 Pumped volume:  (has not started yet)  Interventions Interventions: Breast feeding basics reviewed;Skin to skin;Breast massage;Hand express;Pace feeding;Education;DEBP;Hand pump;Expressed milk  Discharge WIC Program: Yes  Consult Status Consult Status: Follow-up Date: 11/18/20 Follow-up type: In-patient    [name] 11/17/2020, 11:46 PM

## 2020-11-17 NOTE — Progress Notes (Signed)
Patient Vitals for the past 4 hrs:  BP Temp Temp src Pulse Resp  11/17/20 1201 119/74 98 F (36.7 C) Oral 89 --  11/17/20 1131 114/66 -- -- 99 --  11/17/20 1104 130/80 -- -- 92 --  11/17/20 1031 (!) 114/52 -- -- 93 --  11/17/20 1001 130/83 -- -- 85 --  11/17/20 0932 122/74 -- -- 90 --  11/17/20 0927 -- 98.3 F (36.8 C) Oral -- 18  11/17/20 0901 124/84 -- -- 87 --  11/17/20 0831 108/61 -- -- 87 --   Comfortable, not feeling any pressure.  CX 8/90/-1 about an hour ago. FHR Cat 1.  Ctx q 2-3 mintues, pitocin at 66mu/min.

## 2020-11-17 NOTE — Discharge Summary (Signed)
Postpartum Discharge Summary  Date of Service updated     Patient Name: [name] DOB: Nov 25, 1990 MRN: [id number]  Date of admission: 11/16/2020 Delivery date:11/17/2020  Delivering provider: Gifford Shave  Date of discharge: 11/19/2020  Admitting diagnosis: Encounter for elective induction of labor [Z34.90] Intrauterine pregnancy: [redacted]w[redacted]d    Secondary diagnosis:  Active Problems:   Encounter for elective induction of labor  Additional problems: Postpartum hemorrhage with anemia    Discharge diagnosis: Term Pregnancy Delivered                                              Post partum procedures: Jada placement; Venofer infusion Augmentation: AROM, Pitocin, Cytotec, and IP Foley Complications: None  Hospital course: Induction of Labor With Vaginal Delivery   30y.o. yo G3P2002 at 373w2das admitted to the hospital 11/16/2020 for induction of labor.  Indication for induction: Elective.  Patient had an uncomplicated labor course as follows: Membrane Rupture Time/Date: 6:10 AM ,11/17/2020   Delivery Method:Vaginal, Spontaneous  Episiotomy: None  Lacerations:  Sulcus  Details of delivery can be found in separate delivery note.  Patient had a routine postpartum course. Patient is discharged home 11/19/20.  Newborn Data: Birth date:11/17/2020  Birth time:1:32 PM  Gender:Female  Living status:Living  Apgars:8 ,9  Weight:3484 g   Magnesium Sulfate received: No BMZ received: No Rhophylac:N/A MMR:N/A T-DaP:Given prenatally Flu: N/A Transfusion:No; Iron infusion (venofer)  Physical exam  Vitals:   11/18/20 1319 11/18/20 1520 11/18/20 2004 11/19/20 0500  BP: 105/66 (!) 104/49 116/71 121/83  Pulse: 87 80 85 83  Resp: '16 16 18 18  ' Temp: 98.5 F (36.9 C) 98.1 F (36.7 C) 98.5 F (36.9 C) (!) 97.5 F (36.4 C)  TempSrc: Oral Oral Oral   SpO2: 99% 99%    Weight:      Height:       General: alert, cooperative, and no distress Lochia: appropriate Uterine Fundus:  firm Incision: N/A DVT Evaluation: No evidence of DVT seen on physical exam. Negative Homan's sign. Labs: Lab Results  Component Value Date   WBC 16.1 (H) 11/19/2020   HGB 8.5 (L) 11/19/2020   HCT 25.2 (L) 11/19/2020   MCV 91.3 11/19/2020   PLT 204 11/19/2020   CMP 12/30/2006  Glucose 87  BUN 13  Sodium 141  Potassium 4.0  Chloride 104   Edinburgh Score: Edinburgh Postnatal Depression Scale Screening Tool 11/17/2020  I have been able to laugh and see the funny side of things. 0  I have looked forward with enjoyment to things. 0  I have blamed myself unnecessarily when things went wrong. 0  I have been anxious or worried for no good reason. 0  I have felt scared or panicky for no good reason. 0  Things have been getting on top of me. 0  I have been so unhappy that I have had difficulty sleeping. 0  I have felt sad or miserable. 0  I have been so unhappy that I have been crying. 0  The thought of harming myself has occurred to me. 0  Edinburgh Postnatal Depression Scale Total 0     After visit meds:  Allergies as of 11/19/2020       Reactions   Banana Itching, Swelling   Orange Fruit [citrus] Itching, Swelling        Medication List  STOP taking these medications    Blood Pressure Kit Devi   Clindamycin Phos-Benzoyl Perox gel   metroNIDAZOLE 500 MG tablet Commonly known as: FLAGYL   pantoprazole 40 MG tablet Commonly known as: Protonix   Vitafol Ultra 29-0.6-0.4-200 MG Caps       TAKE these medications    acetaminophen 325 MG tablet Commonly known as: Tylenol Take 2 tablets (650 mg total) by mouth every 4 (four) hours as needed (for pain scale < 4).   ferrous sulfate 325 (65 FE) MG EC tablet Take 1 tablet (325 mg total) by mouth every other day.   ibuprofen 600 MG tablet Commonly known as: ADVIL Take 1 tablet (600 mg total) by mouth every 6 (six) hours.               Discharge Care Instructions  (From admission, onward)            Start     Ordered   11/19/20 0000  Discharge wound care:       Comments: As per discharge handout and nursing instructions   11/19/20 1229             Discharge home in stable condition Infant Feeding: Breast Infant Disposition:home with mother Discharge instruction: per After Visit Summary and Postpartum booklet. Activity: Advance as tolerated. Pelvic rest for 6 weeks.  Diet: routine diet and iron rich diet Future Appointments: Future Appointments  Date Time Provider [place]  12/18/2020 10:15 AM Woodroe Mode, MD [place] None   Follow up Visit:   Please schedule this patient for a Virtual postpartum visit in 4 weeks with the following provider: Any provider. Additional Postpartum F/U:  Low risk pregnancy complicated by:  Delivery mode:  Vaginal, Spontaneous SVD Anticipated Birth Control:  PP Depo given    11/19/2020 Katherine Basset, DO

## 2020-11-17 NOTE — Progress Notes (Signed)
[name] is a 30 y.o. G3P2002 at [redacted]w[redacted]d admitted for elective IOL at term.  Subjective: Called by RN to come evaluate vaginal bleeding. Patient passed large blood clot.  Objective: BP 136/86   Pulse 74   Temp 98.5 F (36.9 C) (Oral)   Resp 18   Ht 5\' 5"  (1.651 m)   Wt 85.4 kg   LMP 02/16/2020   BMI 31.33 kg/m  No intake/output data recorded. No intake/output data recorded.  FHT:  FHR: 120 bpm, variability: moderate,  accelerations:  Present,  decelerations:  Absent UC:   regular, every 1.5-4 minutes SVE:   Dilation: 7.5 Effacement (%): 80 Station: -1 Exam by:: [email or web address] CNM AROM with moderate clear fluid that turned bloody from vaginal bleeding  Labs: Lab Results  Component Value Date   WBC 7.0 11/16/2020   HGB 10.8 (L) 11/16/2020   HCT 33.5 (L) 11/16/2020   MCV 93.1 11/16/2020   PLT 260 11/16/2020    Assessment / Plan: Induction of labor due to elective term,  progressing well on pitocin Vaginal Bleeding  Labor: Progressing on Pitocin, will continue to increase until delivery or other problems arise Preeclampsia:   n/a Fetal Wellbeing:  Category I Pain Control:  Labor support without medications - requesting epidural now I/D:   GBS Positive Anticipated MOD:  NSVD  01/16/2021, CNM 11/17/2020, 6:15 AM

## 2020-11-18 LAB — CBC
HCT: 22.3 % — ABNORMAL LOW (ref 36.0–46.0)
Hemoglobin: 7.2 g/dL — ABNORMAL LOW (ref 12.0–15.0)
MCH: 30.1 pg (ref 26.0–34.0)
MCHC: 32.3 g/dL (ref 30.0–36.0)
MCV: 93.3 fL (ref 80.0–100.0)
Platelets: 192 10*3/uL (ref 150–400)
RBC: 2.39 MIL/uL — ABNORMAL LOW (ref 3.87–5.11)
RDW: 14.4 % (ref 11.5–15.5)
WBC: 15.4 10*3/uL — ABNORMAL HIGH (ref 4.0–10.5)
nRBC: 0 % (ref 0.0–0.2)

## 2020-11-18 LAB — PROTIME-INR
INR: 1 (ref 0.8–1.2)
Prothrombin Time: 13.4 seconds (ref 11.4–15.2)

## 2020-11-18 LAB — PREPARE RBC (CROSSMATCH)

## 2020-11-18 MED ORDER — MEDROXYPROGESTERONE ACETATE 150 MG/ML IM SUSP
150.0000 mg | Freq: Once | INTRAMUSCULAR | Status: AC
  Administered 2020-11-19: 150 mg via INTRAMUSCULAR
  Filled 2020-11-18: qty 1

## 2020-11-18 MED ORDER — SODIUM CHLORIDE 0.9% IV SOLUTION: Freq: Once | INTRAVENOUS | Status: AC

## 2020-11-18 MED ORDER — SODIUM CHLORIDE 0.9 % IV SOLN
500.0000 mg | Freq: Once | INTRAVENOUS | Status: DC
  Filled 2020-11-18: qty 25

## 2020-11-18 NOTE — Lactation Note (Signed)
This note was copied from a baby's chart. Lactation Consultation Note  Patient Name: Boy Marigold Mom EUMPN'T Date: 11/18/2020 Reason for consult: Follow-up assessment Age:28 hours  LC in to room for follow up. Mother states she has been formula feeding. Mother explains she has not been able to pump. "Suzan Garibaldi" is cueing during visit, mother starts bottlefeeding formula. Talked about pacing, upright position and frequent burping. Reinforced feeding with volume according to age (15-30 mL). Mother verbalizes in agreement.  Encouraged to contact [facility] for questions or concerns as needed.    Feeding Mother's Current Feeding Choice: Breast Milk and Formula Nipple Type: Extra Slow Flow  Lactation Tools Discussed/Used Tools: Pump Breast pump type: Double-Electric Breast Pump  Interventions Interventions: Pace feeding;Education;DEBP;Breast feeding basics reviewed  Consult Status Consult Status: Follow-up Date: 11/19/20 Follow-up type: In-patient    Shreyas Piatkowski A Higuera Ancidey 11/18/2020, 6:47 PM

## 2020-11-18 NOTE — Progress Notes (Signed)
Post Partum Day 1 Subjective: voiding, tolerating PO, and has not passed flatus/BM yet.   Light vaginal bleeding without clots. Denies fatigue, lightheadedness, HA, SOB. Pain well controlled  Objective: Blood pressure (!) 119/51, pulse 73, temperature 98.1 F (36.7 C), temperature source Oral, resp. rate 18, height 5\' 5"  (1.651 m), weight 85.4 kg, last menstrual period 02/16/2020, SpO2 99 %, unknown if currently breastfeeding.  Physical Exam:  General: alert, cooperative, and no distress Lochia: appropriate Uterine Fundus: firm Incision: no incision DVT Evaluation: No evidence of DVT seen on physical exam.  Recent Labs    11/17/20 1655 11/18/20 0512  HGB 9.1* 7.2*  HCT 28.6* 22.3*    Assessment/Plan: -PPH 1400cc with Hgb 7.2 this morning, will give PRBC -GBS positive, received Unasyn, no fever -Currently mostly bottle feeding with plan for breast once milk comes in -Would like to get Depo Provera shot while IP -circumcision for baby -plan for discharge tomorrow if doing well   LOS: 2 days   01/18/21 11/18/2020, 7:45 AM

## 2020-11-18 NOTE — Anesthesia Postprocedure Evaluation (Signed)
Anesthesia Post Note  Patient: [name]  Procedure(s) Performed: AN AD HOC LABOR EPIDURAL     Patient location during evaluation: Mother Baby Anesthesia Type: Epidural Level of consciousness: awake and alert Pain management: pain level controlled Vital Signs Assessment: post-procedure vital signs reviewed and stable Respiratory status: spontaneous breathing, nonlabored ventilation and respiratory function stable Cardiovascular status: stable Postop Assessment: no headache, no backache and epidural receding Anesthetic complications: no   No notable events documented.  Last Vitals:  Vitals:   11/17/20 2359 11/18/20 0343  BP: 122/67 (!) 119/51  Pulse: 81 73  Resp: 18 18  Temp: 36.8 C 36.7 C  SpO2: 98% 99%    Last Pain:  Vitals:   11/18/20 0715  TempSrc:   PainSc: 0-No pain   Pain Goal:                   [employer]

## 2020-11-18 NOTE — Progress Notes (Signed)
Post Partum Day 1  Subjective: no complaints, up ad lib, voiding, tolerating PO, and + flatus  Objective: Blood pressure 119/82, pulse 84, temperature 99.1 F (37.3 C), resp. rate 20, height 5\' 5"  (1.651 m), weight 85.4 kg, last menstrual period 02/16/2020, SpO2 99 %, unknown if currently breastfeeding.  Physical Exam:  General: alert, cooperative, appears stated age, no distress, and pale Lochia: appropriate Uterine Fundus: firm Incision: N/A DVT Evaluation: No evidence of DVT seen on physical exam.  Recent Labs    11/17/20 1655 11/18/20 0512  HGB 9.1* 7.2*  HCT 28.6* 22.3*    Assessment/Plan: --PPH QBL 1400 mL --S/p Venofir 09/11 at 1812 --Hgb 7.2 this morning, asymptomatic but predominantly in bed so far today --Discussed with Dr. 11/11, will order 1 unit PRBCs --Recheck CBC tomorrow morning --Pt agreeable, plan for discharge tomorrow   LOS: 2 days   Crissie Reese, CNM 11/18/2020, 9:52 AM

## 2020-11-19 LAB — TYPE AND SCREEN
ABO/RH(D): B POS
Antibody Screen: NEGATIVE
Unit division: 0

## 2020-11-19 LAB — CBC
HCT: 25.2 % — ABNORMAL LOW (ref 36.0–46.0)
Hemoglobin: 8.5 g/dL — ABNORMAL LOW (ref 12.0–15.0)
MCH: 30.8 pg (ref 26.0–34.0)
MCHC: 33.7 g/dL (ref 30.0–36.0)
MCV: 91.3 fL (ref 80.0–100.0)
Platelets: 204 10*3/uL (ref 150–400)
RBC: 2.76 MIL/uL — ABNORMAL LOW (ref 3.87–5.11)
RDW: 15 % (ref 11.5–15.5)
WBC: 16.1 10*3/uL — ABNORMAL HIGH (ref 4.0–10.5)
nRBC: 0 % (ref 0.0–0.2)

## 2020-11-19 LAB — BPAM RBC
Blood Product Expiration Date: 202210052359
ISSUE DATE / TIME: 202209121226
Unit Type and Rh: 7300

## 2020-11-19 LAB — SURGICAL PATHOLOGY

## 2020-11-19 MED ORDER — ACETAMINOPHEN 325 MG PO TABS: 650.0000 mg | ORAL_TABLET | ORAL | 0 refills | Status: AC | PRN

## 2020-11-19 MED ORDER — MEDROXYPROGESTERONE ACETATE 150 MG/ML IM SUSP: 150.0000 mg | Freq: Once | INTRAMUSCULAR | Status: DC

## 2020-11-19 MED ORDER — IBUPROFEN 600 MG PO TABS: 600.0000 mg | ORAL_TABLET | Freq: Four times a day (QID) | ORAL | 0 refills | Status: DC

## 2020-11-19 MED ORDER — FERROUS SULFATE 325 (65 FE) MG PO TBEC: 325.0000 mg | DELAYED_RELEASE_TABLET | ORAL | 0 refills | Status: AC

## 2020-11-19 NOTE — Lactation Note (Signed)
This note was copied from a baby's chart. Lactation Consultation Note  Patient Name: [name] [id number] Date: 11/19/2020 Reason for consult: Follow-up assessment;Term Age:28 hours   P3 mother whose infant is now 21 hours old.  This is a term baby at 39+2 weeks.  Mother breast fed her other children (now 19 and 2 years old) for one month each.  Mother's feeding preference is breast/formula.  Mother recently formula fed baby; she has not been breast feeding.  Mother is interested in beginning to breast feed after discharge.  Discussed "supply and demand" and the importance of pumping and/or breast feeding to help ensure a good milk supply.  Mother has not been pumping with the DEBP.  She has a DEBP for home use and will begin using it today.  Mother is ready to be discharged.  She has our OP phone number for any questions/concerns.  No support person present at this time.   Maternal Data Has patient been taught Hand Expression?: Yes Does the patient have breastfeeding experience prior to this delivery?: Yes How long did the patient breastfeed?: 1 month with each of her other two children  Feeding Mother's Current Feeding Choice: Breast Milk and Formula Nipple Type: Extra Slow Flow  LATCH Score                    Lactation Tools Discussed/Used Tools: Pump;Flanges Flange Size: 24;27 Breast pump type: Double-Electric Breast Pump  Interventions    Discharge Discharge Education: Engorgement and breast care Pump: DEBP;Manual;Personal WIC Program: Yes  Consult Status Consult Status: Complete Date: 11/19/20 Follow-up type: Call as needed    [name] [name] 11/19/2020, 7:41 AM

## 2020-12-02 ENCOUNTER — Telehealth (HOSPITAL_COMMUNITY): Payer: Self-pay | Admitting: *Deleted

## 2020-12-02 NOTE — Telephone Encounter (Signed)
No answer, no message left.  Duffy Rhody, RN 12-02-2020 at 11:32am

## 2020-12-18 ENCOUNTER — Encounter: Payer: Medicaid Other | Admitting: Obstetrics & Gynecology

## 2020-12-18 DIAGNOSIS — Z3480 Encounter for supervision of other normal pregnancy, unspecified trimester: Secondary | ICD-10-CM

## 2020-12-18 NOTE — Progress Notes (Signed)
Unable to connect for virtual visit

## 2020-12-23 ENCOUNTER — Telehealth (INDEPENDENT_AMBULATORY_CARE_PROVIDER_SITE_OTHER): Payer: Medicaid Other | Admitting: Obstetrics and Gynecology

## 2020-12-23 ENCOUNTER — Encounter: Payer: Self-pay | Admitting: Obstetrics and Gynecology

## 2020-12-23 NOTE — Progress Notes (Deleted)
Provider location: Center for [facility]   Patient location: Home  I connected withNAME@ on 12/23/20 at 10:55 AM EDT by Mychart Video Encounter and verified that I am speaking with the correct person using two identifiers.       I discussed the limitations, risks, security and privacy concerns of performing an evaluation and management service virtually and the availability of in person appointments. I also discussed with the patient that there may be a patient responsible charge related to this service. The patient expressed understanding and agreed to proceed.  Post Partum Visit Note Subjective:   [name] is a 30 y.o. G43P3003 female who presents for a postpartum visit. She is 4 weeks postpartum following a normal spontaneous vaginal delivery.  I have fully reviewed the prenatal and intrapartum course. The delivery was at 39.2 gestational weeks.  Anesthesia: epidural. Postpartum course has been unremarkable. Baby is doing well. Baby is feeding by bottle - Similac Advance. Bleeding staining only. Bowel function is normal. Bladder function is normal. Patient is not sexually active. Contraception method is Depo-Provera injections. Postpartum depression screening: negative.   The pregnancy intention screening data noted above was reviewed. Potential methods of contraception were discussed. The patient elected to proceed with No data recorded.   Edinburgh Postnatal Depression Scale - 12/23/20 0912       Edinburgh Postnatal Depression Scale:  In the Past 7 Days   I have been able to laugh and see the funny side of things. 0    I have looked forward with enjoyment to things. 0    I have blamed myself unnecessarily when things went wrong. 0    I have been anxious or worried for no good reason. 0    I have felt scared or panicky for no good reason. 0    Things have been getting on top of me. 0    I have been so unhappy that I have had difficulty sleeping. 0    I  have felt sad or miserable. 0    I have been so unhappy that I have been crying. 0    The thought of harming myself has occurred to me. 0    Edinburgh Postnatal Depression Scale Total 0             {Common ambulatory SmartLinks:19316}  Review of Systems {ros; complete:30496}  Objective:  LMP 02/16/2020   Breastfeeding No     General:  Alert, oriented and cooperative. Patient is in no acute distress.  Respiratory: Normal respiratory effort, no problems with respiration noted  Mental Status: Normal mood and affect. Normal behavior. Normal judgment and thought content.  Rest of physical exam deferred due to type of encounter   Assessment:    *** postpartum exam.  Plan:  Essential components of care per ACOG recommendations:  1.  Mood and well being: Patient with {gen negative/positive:315881} depression screening today. Reviewed local resources for support.  - Patient {Action; does/does not:19097} use tobacco. ***If using tobacco we discussed reduction and for recently cessation risk of relapse - hx of drug use? {yes/no:20286}  *** If yes, discussed support systems  2. Infant care and feeding:  -Patient currently breastmilk feeding? {yes/no:20286} ***If breastmilk feeding discussed return to work and pumping. If needed, patient was provided letter for work to allow for every 2-3 hr pumping breaks, and to be granted a private location to express breastmilk and refrigerated area to store breastmilk. Reviewed importance of draining breast regularly to  support lactation. -Social determinants of health (SDOH) reviewed in EPIC. No concerns***The following needs were identified***  3. Sexuality, contraception and birth spacing - Patient {DOES_DOES AXK:55374} want a pregnancy in the next year.  Desired family size is {NUMBER 1-10:22536} children.  - Reviewed forms of contraception in tiered fashion. Patient desired {PLAN CONTRACEPTION:313102} today.   - Discussed birth spacing of 18  months  4. Sleep and fatigue -Encouraged family/partner/community support of 4 hrs of uninterrupted sleep to help with mood and fatigue  5. Physical Recovery  - Discussed patients delivery*** and complications - Patient had a *** degree laceration, perineal healing reviewed. Patient expressed understanding - Patient has urinary incontinence? {yes/no:20286}*** Patient was referred to pelvic floor PT  - Patient {ACTION; IS/IS MOL:07867544} safe to resume physical and sexual activity  6.  Health Maintenance - Last pap smear done *** and was {Normal/abnormal wildcard:19619} with negative HPV. ***Mammogram  7. ***Chronic Disease - PCP follow up  I provided *** minutes of face-to-face time during this encounter.    Return in 3 months (on 03/25/2021).  Future Appointments  Date Time Provider Department Center  12/23/2020 10:55 AM Gregery Walberg, Gigi Gin, MD [facility] None     Center for [employer], [facility]

## 2020-12-23 NOTE — Progress Notes (Signed)
Provider location: Center for [facility]   Patient location: Home  I connected withNAME@ on 12/23/20 at 10:55 AM EDT by Mychart Video Encounter and verified that I am speaking with the correct person using two identifiers.       I discussed the limitations, risks, security and privacy concerns of performing an evaluation and management service virtually and the availability of in person appointments. I also discussed with the patient that there may be a patient responsible charge related to this service. The patient expressed understanding and agreed to proceed.  Post Partum Visit Note Subjective:   [name] is a 30 y.o. G75P3003 female who presents for a postpartum visit. She is 5 weeks postpartum following a normal spontaneous vaginal delivery.  I have fully reviewed the prenatal and intrapartum course. The delivery was at 39.1 gestational weeks.  Anesthesia: epidural. Postpartum course has been uncomplicated. Baby is doing well. Baby is feeding by bottle - Similac Advance. Bleeding no bleeding. Bowel function is normal. Bladder function is normal. Patient is not sexually active. Contraception method is Depo-Provera injections. Postpartum depression screening: negative.   The pregnancy intention screening data noted above was reviewed. Potential methods of contraception were discussed. The patient elected to proceed with No data recorded.   Edinburgh Postnatal Depression Scale - 12/23/20 0912       Edinburgh Postnatal Depression Scale:  In the Past 7 Days   I have been able to laugh and see the funny side of things. 0    I have looked forward with enjoyment to things. 0    I have blamed myself unnecessarily when things went wrong. 0    I have been anxious or worried for no good reason. 0    I have felt scared or panicky for no good reason. 0    Things have been getting on top of me. 0    I have been so unhappy that I have had difficulty sleeping. 0    I  have felt sad or miserable. 0    I have been so unhappy that I have been crying. 0    The thought of harming myself has occurred to me. 0    Edinburgh Postnatal Depression Scale Total 0                Review of Systems Pertinent items noted in HPI and remainder of comprehensive ROS otherwise negative.  Objective:  LMP 02/16/2020   Breastfeeding No     General:  Alert, oriented and cooperative. Patient is in no acute distress.  Respiratory: Normal respiratory effort, no problems with respiration noted  Mental Status: Normal mood and affect. Normal behavior. Normal judgment and thought content.  Rest of physical exam deferred due to type of encounter   Assessment:    Normal postpartum exam.  Plan:  Essential components of care per ACOG recommendations:  1.  Mood and well being: Patient with negative depression screening today. Reviewed local resources for support.  - Patient does not use tobacco.  - hx of drug use? No    2. Infant care and feeding:  -Patient currently breastmilk feeding? No  -Social determinants of health (SDOH) reviewed in EPIC. No concerns  3. Sexuality, contraception and birth spacing - Patient does not want a pregnancy in the next year.   - Reviewed forms of contraception in tiered fashion. Patient desired Depo-Provera received upon hospital discharge.   - Discussed birth spacing of 18 months  4. Sleep  and fatigue -Encouraged family/partner/community support of 4 hrs of uninterrupted sleep to help with mood and fatigue  5. Physical Recovery  - Discussed patients delivery and complications - Patient had a sulcal laceration, perineal healing reviewed. Patient expressed understanding - Patient has urinary incontinence? No  - Patient is safe to resume physical and sexual activity  6.  Health Maintenance - Last pap smear done 2019 and was normal with negative HPV. - Patient will be scheduled for pap smear with next depo-provera  7. Chronic  Disease - PCP follow up  I provided 15 minutes of face-to-face time during this encounter.    Return in 3 months (on 03/25/2021) for annual exam with pap smear.  Future Appointments  Date Time Provider Department Center  12/23/2020 10:55 AM Onnika Siebel, Gigi Gin, MD [facility] None    Catalina Antigua, MD Center for [facility], [facility]

## 2021-02-14 ENCOUNTER — Other Ambulatory Visit: Payer: Self-pay

## 2021-02-14 ENCOUNTER — Ambulatory Visit (INDEPENDENT_AMBULATORY_CARE_PROVIDER_SITE_OTHER): Payer: Medicaid Other | Admitting: *Deleted

## 2021-02-14 VITALS — BP 130/87 | HR 73

## 2021-02-14 DIAGNOSIS — Z3042 Encounter for surveillance of injectable contraceptive: Secondary | ICD-10-CM | POA: Diagnosis not present

## 2021-02-14 MED ORDER — MEDROXYPROGESTERONE ACETATE 150 MG/ML IM SUSP
150.0000 mg | Freq: Once | INTRAMUSCULAR | Status: AC
  Administered 2021-02-14: 150 mg via INTRAMUSCULAR

## 2021-02-14 MED ORDER — MEDROXYPROGESTERONE ACETATE 150 MG/ML IM SUSP: 150.0000 mg | INTRAMUSCULAR | 0 refills | Status: DC

## 2021-02-14 NOTE — Progress Notes (Signed)
Date last pap: 12/20/17. (Postpartum video visit 12/23/20) Reminded patient she needs to schedule annual/ Pap  Last Depo-Provera: 11/19/20, postpartum.  Side Effects if any: NA.  Serum HCG indicated? NA.  Depo-Provera 150 mg IM given by: Selena Batten. Tyson Masin, RNC in Right Deltoid. Given Depo from office supply. RX not written by discharge provider postpartum. RX sent for next injection to be given at scheduled annual.  Next appointment due 05/02/21-05/16/21.

## 2021-05-06 ENCOUNTER — Other Ambulatory Visit: Payer: Self-pay

## 2021-05-06 ENCOUNTER — Ambulatory Visit (INDEPENDENT_AMBULATORY_CARE_PROVIDER_SITE_OTHER): Payer: Medicaid Other

## 2021-05-06 DIAGNOSIS — Z3042 Encounter for surveillance of injectable contraceptive: Secondary | ICD-10-CM

## 2021-05-06 MED ORDER — MEDROXYPROGESTERONE ACETATE 150 MG/ML IM SUSP
150.0000 mg | Freq: Once | INTRAMUSCULAR | Status: AC
  Administered 2021-05-06: 150 mg via INTRAMUSCULAR

## 2021-05-06 NOTE — Progress Notes (Cosign Needed)
Patient presents for depo injection. Patient is within her window. Inj given in LD. Patient tolerated well. Next depo due 5/16-5/30.

## 2021-05-27 NOTE — Progress Notes (Signed)
Patient was assessed and managed by nursing staff during this encounter. I have reviewed the chart and agree with the documentation and plan. I have also made any necessary editorial changes.  Scheryl Darter, MD 05/27/2021 1:27 PM

## 2021-07-22 ENCOUNTER — Ambulatory Visit: Payer: Medicaid Other

## 2021-07-23 ENCOUNTER — Other Ambulatory Visit: Payer: Self-pay | Admitting: Obstetrics and Gynecology

## 2021-07-23 DIAGNOSIS — Z3042 Encounter for surveillance of injectable contraceptive: Secondary | ICD-10-CM

## 2021-08-05 ENCOUNTER — Ambulatory Visit (INDEPENDENT_AMBULATORY_CARE_PROVIDER_SITE_OTHER): Payer: Medicaid Other | Admitting: Emergency Medicine

## 2021-08-05 VITALS — BP 128/90 | HR 78 | Ht 65.0 in | Wt 169.9 lb

## 2021-08-05 DIAGNOSIS — Z3042 Encounter for surveillance of injectable contraceptive: Secondary | ICD-10-CM | POA: Diagnosis not present

## 2021-08-05 MED ORDER — MEDROXYPROGESTERONE ACETATE 150 MG/ML IM SUSP
150.0000 mg | Freq: Once | INTRAMUSCULAR | Status: AC
  Administered 2021-08-05: 150 mg via INTRAMUSCULAR

## 2021-08-05 NOTE — Progress Notes (Signed)
Date last pap: 12/20/17. Last Depo-Provera: 05/06/21. Side Effects if any: Pt has continued spotting. Recommended to make apt with provider to discuss. Serum HCG indicated? N/A. Depo-Provera 150 mg IM given by: Resa Miner, RN into left deltoid, tolerated well, no adverse effects noted. Next appointment due Aug 15-Aug 29.

## 2021-08-30 IMAGING — US US FETAL BPP W/ NON-STRESS
1 series · 13 of 15 positions shown · non-contrast
Comparison: none

[Series 1: us fetal bpp w/nonstress · 15 acquisitions, 13 frames shown]
[im 1/15]
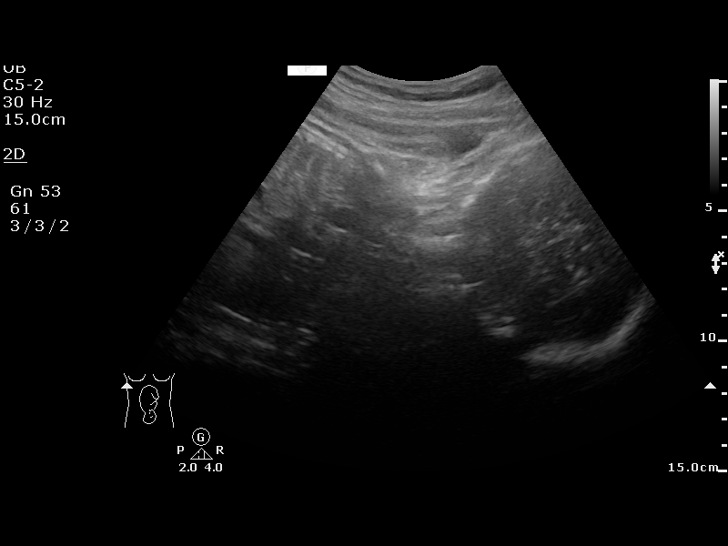
[im 2/15]
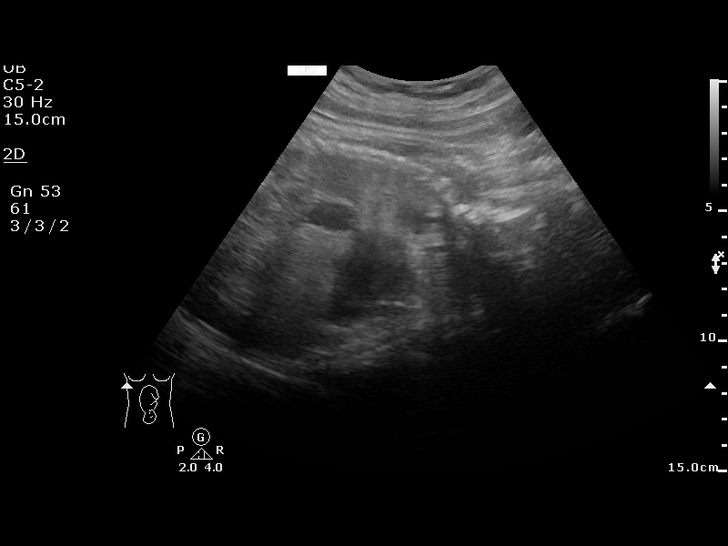
[im 3/15]
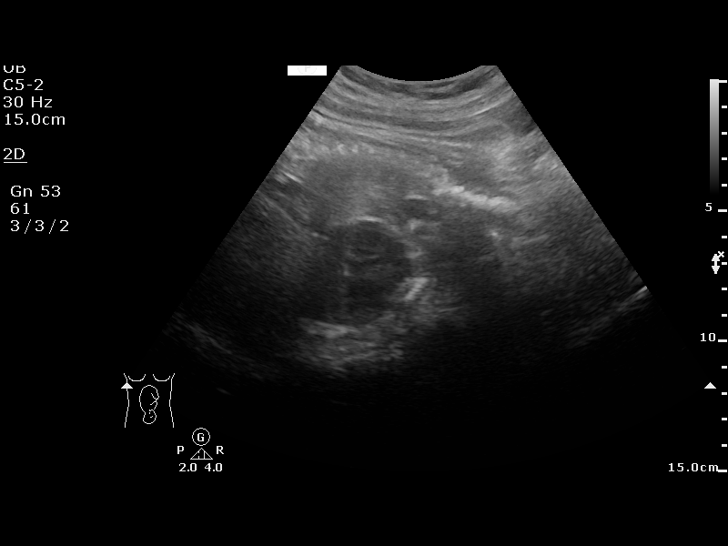
[im 5/15]
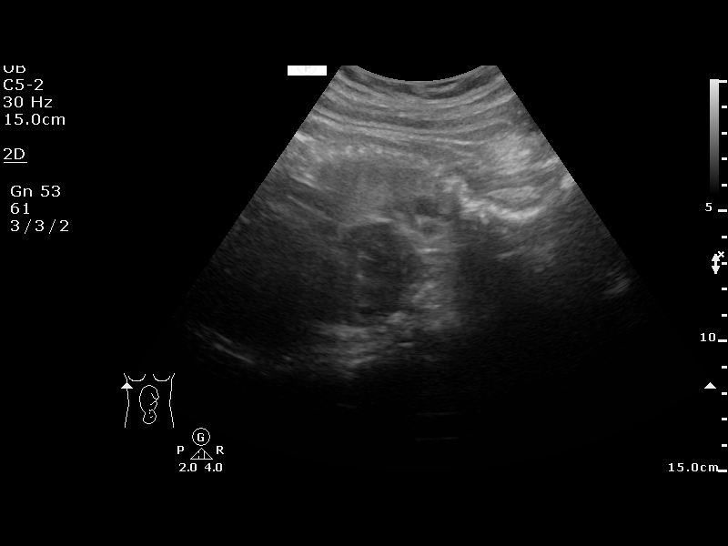
[im 6/15]
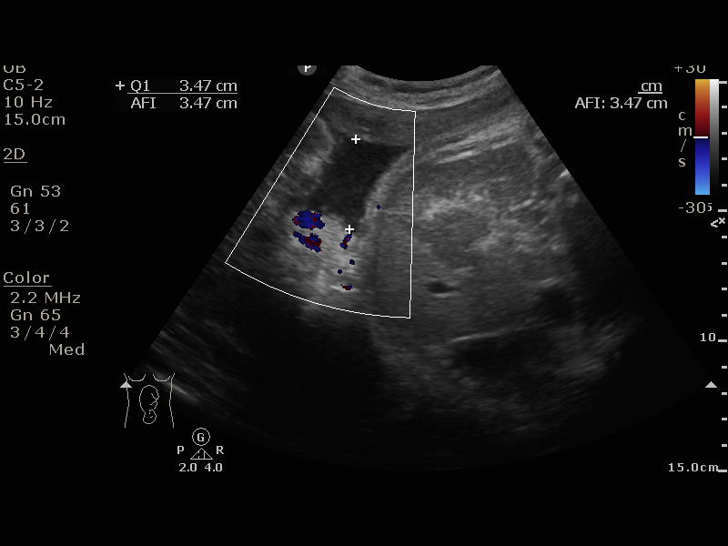
[im 7/15]
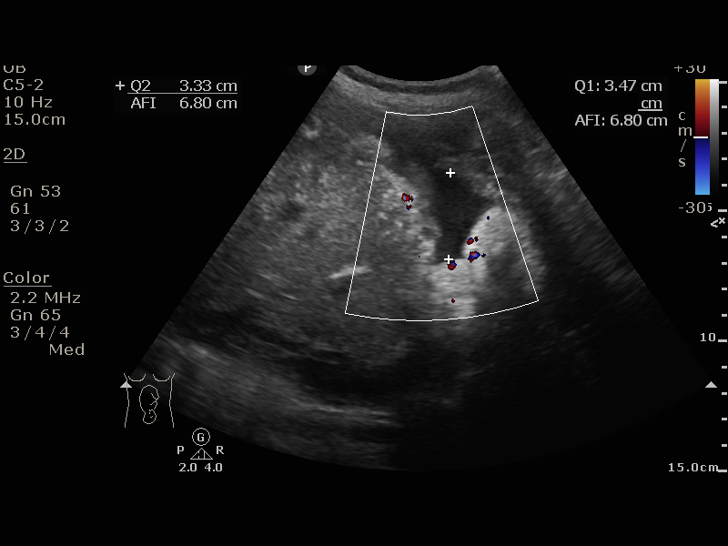
[im 8/15]
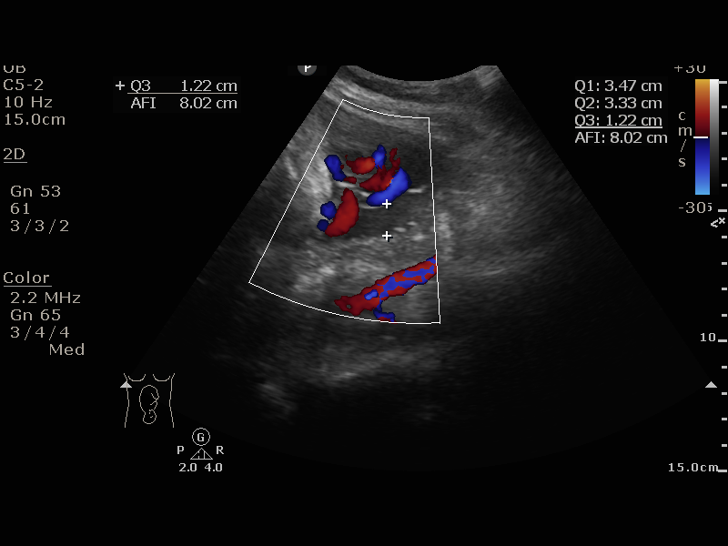
[im 9/15]
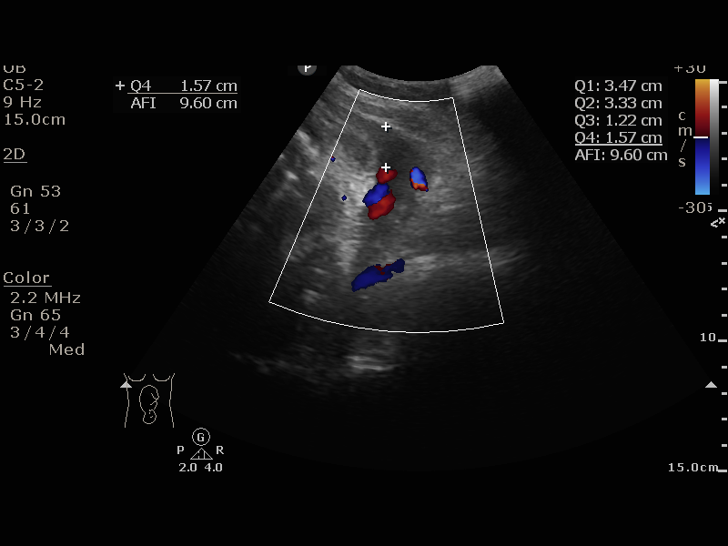
[im 10/15]
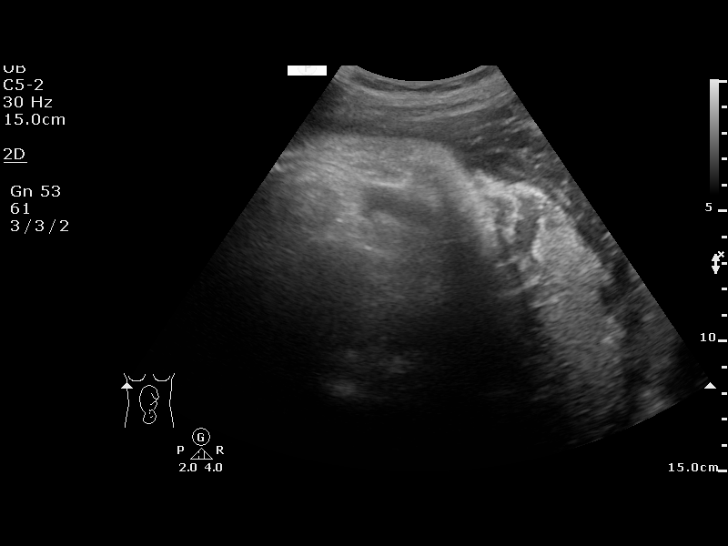
[im 11/15]
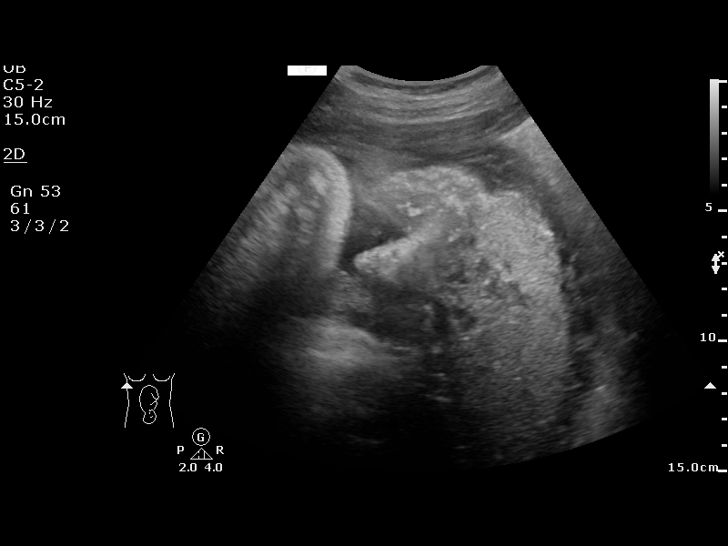
[im 13/15]
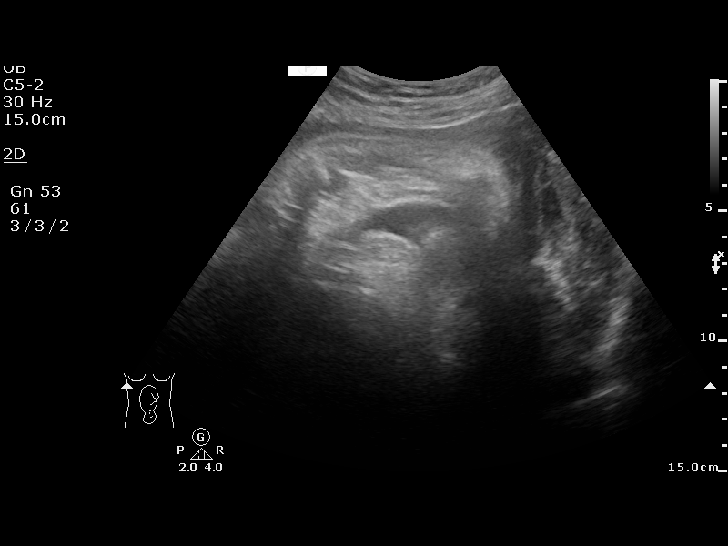
[im 14/15]
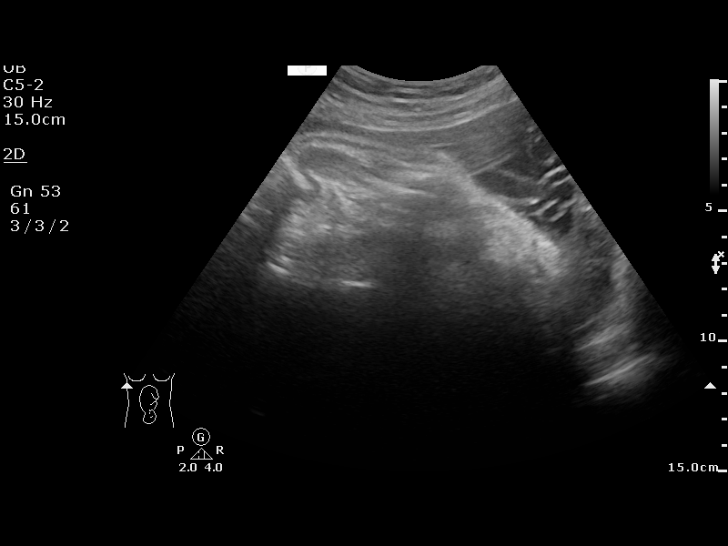
[im 15/15]
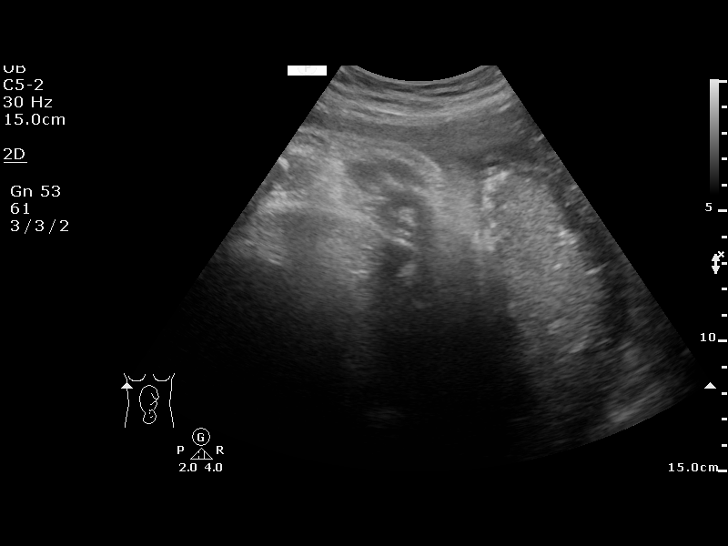

[13 of 15 positions shown; findings below may reference images not displayed]

[address]
                                                            Women's
                                                            [REDACTED]

  1  US FETAL BPP W/NONSTRESS             76818.4      MARIAPAULA RENGIFO
 ----------------------------------------------------------------------

 ----------------------------------------------------------------------
Service(s) Provided

 ----------------------------------------------------------------------
Indications

  40 weeks gestation of pregnancy
  Postdate pregnancy (40-42 weeks)
 ----------------------------------------------------------------------
Vital Signs

                                                Height:        5'5"
Fetal Evaluation

 Num Of Fetuses:         1
 Preg. Location:         Intrauterine
 Cardiac Activity:       Observed
 Presentation:           Cephalic

 Amniotic Fluid
 AFI FV:      Within normal limits

 AFI Sum(cm)     %Tile       Largest Pocket(cm)
 9.59            30

 RUQ(cm)       RLQ(cm)       LUQ(cm)        LLQ(cm)

Biophysical Evaluation

 Amniotic F.V:   Pocket => 2 cm two         F. Tone:        Observed
                 planes
 F. Movement:    Observed                   N.S.T:          Reactive
 F. Breathing:   Observed                   Score:          [DATE]
OB History

 Gravidity:    2         Term:   1        Prem:   0        SAB:   0
 TOP:          0       Ectopic:  0        Living: 1
Gestational Age

 LMP:           40w 6d        Date:  01/25/18                 EDD:   11/01/18
 Best:          40w 6d     Det. By:  LMP  (01/25/18)          EDD:   11/01/18
Impression

Recommendations

 Patient scheduled for induction of [place]
                  Onaa Astray, DO

## 2021-09-26 ENCOUNTER — Other Ambulatory Visit: Payer: Self-pay | Admitting: Obstetrics and Gynecology

## 2021-09-26 DIAGNOSIS — Z3042 Encounter for surveillance of injectable contraceptive: Secondary | ICD-10-CM

## 2021-09-30 ENCOUNTER — Other Ambulatory Visit: Payer: Self-pay

## 2021-09-30 DIAGNOSIS — Z3042 Encounter for surveillance of injectable contraceptive: Secondary | ICD-10-CM

## 2021-09-30 MED ORDER — MEDROXYPROGESTERONE ACETATE 150 MG/ML IM SUSP: 150.0000 mg | INTRAMUSCULAR | 0 refills | Status: DC

## 2021-10-18 ENCOUNTER — Other Ambulatory Visit: Payer: Self-pay | Admitting: Obstetrics and Gynecology

## 2021-10-18 DIAGNOSIS — Z3042 Encounter for surveillance of injectable contraceptive: Secondary | ICD-10-CM

## 2021-11-03 ENCOUNTER — Ambulatory Visit (INDEPENDENT_AMBULATORY_CARE_PROVIDER_SITE_OTHER): Payer: Medicaid Other | Admitting: Emergency Medicine

## 2021-11-03 DIAGNOSIS — Z3042 Encounter for surveillance of injectable contraceptive: Secondary | ICD-10-CM

## 2021-11-03 MED ORDER — MEDROXYPROGESTERONE ACETATE 150 MG/ML IM SUSP
150.0000 mg | Freq: Once | INTRAMUSCULAR | Status: AC
  Administered 2021-11-03: 150 mg via INTRAMUSCULAR

## 2021-11-03 NOTE — Progress Notes (Signed)
Date last pap: 12/20/2017. Last Depo-Provera: 08/05/2021. Side Effects if any: Breakthrough bleeding Serum HCG indicated? NA. Depo-Provera 150 mg IM given by: Resa Miner, RN into LEFT deltoid, tolerated well. Marland Kitchen Next appointment due Nov 13-27.

## 2022-01-13 ENCOUNTER — Ambulatory Visit
Admission: EM | Admit: 2022-01-13 | Discharge: 2022-01-13 | Discharge status: Absent without leave | Payer: Medicaid Other

## 2022-02-03 ENCOUNTER — Other Ambulatory Visit (HOSPITAL_COMMUNITY)
Admission: RE | Admit: 2022-02-03 | Discharge: 2022-02-03 | Disposition: A | Discharge status: Home / Self Care | Payer: Medicaid Other | Source: Ambulatory Visit

## 2022-02-03 ENCOUNTER — Ambulatory Visit (INDEPENDENT_AMBULATORY_CARE_PROVIDER_SITE_OTHER): Payer: Medicaid Other

## 2022-02-03 VITALS — BP 131/90 | HR 75 | Ht 65.0 in | Wt 167.6 lb

## 2022-02-03 DIAGNOSIS — Z124 Encounter for screening for malignant neoplasm of cervix: Secondary | ICD-10-CM | POA: Insufficient documentation

## 2022-02-03 DIAGNOSIS — Z113 Encounter for screening for infections with a predominantly sexual mode of transmission: Secondary | ICD-10-CM

## 2022-02-03 DIAGNOSIS — Z1239 Encounter for other screening for malignant neoplasm of breast: Secondary | ICD-10-CM | POA: Diagnosis not present

## 2022-02-03 DIAGNOSIS — Z3042 Encounter for surveillance of injectable contraceptive: Secondary | ICD-10-CM

## 2022-02-03 DIAGNOSIS — Z01419 Encounter for gynecological examination (general) (routine) without abnormal findings: Secondary | ICD-10-CM | POA: Diagnosis not present

## 2022-02-03 MED ORDER — MEDROXYPROGESTERONE ACETATE 150 MG/ML IM SUSP
150.0000 mg | Freq: Once | INTRAMUSCULAR | Status: AC
  Administered 2022-02-03: 150 mg via INTRAMUSCULAR

## 2022-02-03 NOTE — Progress Notes (Signed)
Pt presents for AEX and Depo injection. Pt requesting STD testing. Pt c/o spotting for 2-3 months with Depo.   Date last pap: 12-20-2017. Last Depo-Provera: 11-03-21. Side Effects if any: Breakthrough bleeding. Serum HCG indicated? NA. Pt provided Depo Depo-Provera 150 mg IM given by: Hope Pigeon, CMA in the right deltoid per pt request. Next appointment due 2/13-2/27.

## 2022-02-03 NOTE — Progress Notes (Signed)
GYNECOLOGY OFFICE VISIT NOTE-WELL WOMAN EXAM  History:   [name] is a 31 year old G3P3003 here today for annual exam. She denies issues including abnormal vaginal discharge, pelvic pain or pain or discomfort with sexual activity.   Birth Control:  Depo Provera. She reports heavy bleeding for the past 3 months, but states it is now spotting.    Reproductive Concerns Sexual Activity: Yes Partners Type: Males Number of partners in last year: One STD Testing: Full  Vaginal/GU Concerns: No issues with urination, constipation, or diarrhea.  Breast Concerns/Exams: No breast exams, endorses SBA.  She denies family history of breast, uterine, or cervical.  Patient reports her mother died from ovarian cancer, but she was unsure. Attempted to look up records, of mother, to verify but nothing reported.   Medical and Nutrition PCP: None Significant PMx: Asthma, No issues in the past 4 years.  Exercise: "Going up and down the steps."   Tobacco/Drugs/Alcohol: Denies Nutrition: Not really, I just eat. She reports she is allergic to fruits.   Social Safety at home: Endorses, Lives with 3 kids and sometimes their FOB.  DV/A: Denies Social Support: Endorses Employment: None  Past Medical History:  Diagnosis Date   Allergy    Asthma    Vaginal Pap smear, abnormal    +HPV, LSIL    Past Surgical History:  Procedure Laterality Date   WISDOM TOOTH EXTRACTION Bilateral 2010    The following portions of the patient's history were reviewed and updated as appropriate: allergies, current medications, past family history, past medical history, past social history, past surgical history and problem list.   Health Maintenance:  Normal pap and negative HRHPV on Oct 2019. Pap collected today.  No mammogram on file d/t age.   Review of Systems:  Pertinent items noted in HPI and remainder of comprehensive ROS otherwise negative.    Objective:    Physical Exam BP (!) 131/90   Pulse  75   Ht 5\' 5"  (1.651 m)   Wt 167 lb 9.6 oz (76 kg)   BMI 27.89 kg/m  Physical Exam Vitals reviewed. Exam conducted with a chaperone present.  Constitutional:      Appearance: Normal appearance.  Eyes:     Conjunctiva/sclera: Conjunctivae normal.  Cardiovascular:     Rate and Rhythm: Normal rate and regular rhythm.     Heart sounds: Normal heart sounds.  Pulmonary:     Effort: Pulmonary effort is normal. No respiratory distress.     Breath sounds: Normal breath sounds.  Chest:  Breasts:    Right: Normal. No mass, nipple discharge, skin change or tenderness.     Left: Normal. No mass, nipple discharge, skin change or tenderness.  Abdominal:     General: Bowel sounds are normal.     Palpations: Abdomen is soft.     Tenderness: There is no abdominal tenderness.  Genitourinary:    Comments: NEFG Blood noted in vaginal vault. CV collected Cervix closed with some blood from os. Pap collected with brush and spatula. Uterus w/o tenderness. Appropriate size.  No adnexa tenderness.  Musculoskeletal:        General: Normal range of motion.     Cervical back: Normal range of motion.  Lymphadenopathy:     Upper Body:     Right upper body: No axillary or pectoral adenopathy.     Left upper body: No axillary or pectoral adenopathy.  Skin:    General: Skin is warm and dry.  Comments: Multiple tattoos noted Bruising on bilateral inner thighs. Yellowish green in color.   Neurological:     Mental Status: She is alert and oriented to person, place, and time.  Psychiatric:        Mood and Affect: Mood normal.        Behavior: Behavior normal.      Labs and Imaging No results found for this or any previous visit (from the past 168 hour(s)). No results found.   Assessment & Plan:  31 year old female Well Woman Exam with Pap Breast Exam Depo Provera STD Testing  1. Well woman exam with routine gynecological exam -Exam performed and findings discussed. -Encouraged to activate  and utilize Mychart for reviewing of results, communication with office, and scheduling of appts. -Educated on AHA exercise recommendations of 30 minutes of moderate to vigorous activity at least 5x/week. -Encouraged to obtain a PCP for yearly testing and management of primary concerns/illnesses.   - Cytology - PAP( [place]) - Cervicovaginal ancillary only( [place]) - HIV antibody (with reflex) - RPR - Hepatitis C Antibody - Hepatitis B Surface AntiGEN  2. Routine cervical smear -Educated on ASCCP guidelines regarding pap smear evaluation and frequency. -Informed of turnover time and provider/clinic policy on releasing results. - Cytology - PAP( [place])  3. Screening breast examination -CBE performed and normal -Educated and encouraged to initiate monthly SBE with increased breast awareness including examination of breast for skin changes, moles, tenderness, etc.   4. Screening examination for STD (sexually transmitted disease) -full testing per patient request. -Will treat as appropriate.  - Cervicovaginal ancillary only( [place]) - HIV antibody (with reflex) - RPR - Hepatitis C Antibody - Hepatitis B Surface AntiGEN  5. Encounter for management and injection of depo-Provera -Reassured that bleeding should continue to improve with most recent injection. -Instructed to send provider a mychart message if no improvement in 2-3 weeks. -Plan to give 30-60 day supply of OCPs for endometrium stabilization/management. -Patient verbalizes understanding and without questions.   Routine preventative health maintenance measures emphasized. Please refer to After Visit Summary for other counseling recommendations.   No follow-ups on file.      Cherre Robins, CNM 02/03/2022

## 2022-02-03 NOTE — Patient Instructions (Signed)
AREA FAMILY PRACTICE PHYSICIANS  [place] ([address]) [facility] [address] [place], [place] [address] [phone] Mon-Fri 8:30-12:30, 1:30-5:00 Accepting Medicaid [facility] [address], [place], [place] [address] [phone] Mon-Fri 8:00-5:30 [facility] [address] [place], [place] [address] [phone] Mon, Tue, Thur, Fri 8:30-5:00, Wed 10:00-7:00 (closed 1-2pm) Accepting Medicaid [facility] [address], [place], [place]  [address] Phone - [phone]   Fax - [phone]  [place] ([address]) [facility] [address] [place], [place] [address] [phone] Mon-Fri 8:00-5:00 [facility] ([facility])  [address] [place], [place] [address] [phone] Mon-Fri 8:30-5:30, Sat (Oct.-Mar.) 9:00-1:00 Accepting Medicaid  [place] ([address]) [facility] [address], [place], [place] [address] [phone] Mon-Fri 8:00-5:00  [place] ([address]) [facility] [address], [place], [place] [address] [phone] Mon-Fri 8:00-5:00 [facility] [address], [place], [place] [address] [phone] Mon-Fri 8:00-5:00 [facility] [address] [place], [place] [address] [phone] Mon-Fri 8:00-5:00 [facility] [address] [place] [address] [phone] Mon-Fri 7:30-5:30  [place] ([address] & [address]) [facility] [address] [place], [place] [address] [phone] Mon-Thur 8:00-6:00 Accepting Medicaid [facility] [address] [place], [place] [address] [phone] Mon-Thur 7:30-7:30, Fri 7:30-4:30 Accepting  Medicaid [facility] [address], [place], [place]  [address] [phone]   Fax - [phone]  [place] ([address] & [address]) [facility] [address] , [place] [address] [phone] Mon-Fri 7:00-5:00 [facility] [address], [place], [place] [address] [phone] Mon-Fri 8:00-5:00 Accepting Medicaid [facility] [address], , [place] [address] [phone] Mon-Fri 8:00-5:00 Accepting Medicaid  [place] ([address]) [facility] [place] [address] [place], [place] [address] [phone] Mon-Fri 8:00-5:00 [facility] [address], [place], [place] [address] [phone] Mon-Fri 8:00-5:00 Accepting Medicaid [facility] ([facility]) [address], [place], [place] [address] [phone] Mon-Fri 8:00-5:30, Sat&Sun by appointment (phones open at 8:30) Accepting Medicaid  [place] ([address]) [facility] [address] [place], [place] [address] [phone] Mon-Thur 8:00-7:00, Fri 8:00-5:00, Sat 8:00-12:00, Sun 9:00-12:00 Accepting Medicaid [facility] [place] [address], [place], [place] [address] [phone] Mon-Thur 8:00-5:00 Accepting Medicaid [facility] [address] [place], [place] [address] [phone] Mon-Fri 8:00-5:30, Sat (Oct.-Mar.) 9:00-1:00 Accepting Medicaid  [place] ([address]) [facility] [address], [place], [place] [address] [phone] Mon-Fri 8:00-5:00 Accepting Medicaid   [place] ([address]) [facility] [place] [address], [place], [place] [address] [phone] Mon-Fri 8:00-5:00 [facility] [place] [address], [place],  [address] [phone] Mon-Fri 8:00-5:00 [facility] [place] [address] [place] [address], [place],  [address] [phone] Mon-Fri 8:00-5:00 After hours clinic ([address] [place],  [address]) [phone] Mon-Fri 5:00-8:00, Sat 12:00-6:00, Sun 10:00-4:00 Accepting Medicaid [facility] [place]   [address], [place], [place]  [address] [phone]   Fax - [phone]  [place] ([address]) [facility] [place] [address], [place], [place] [address] [phone] Mon-Fri 8:00-5:00 [facility] [place] ([facility] [place]) [address], [place],  [address] [phone] Mon-Thur 8:00-7:00, Fri 8:00-5:00, Sat 8:00-12:00    

## 2022-02-04 LAB — CERVICOVAGINAL ANCILLARY ONLY
Chlamydia: NEGATIVE
Comment: NEGATIVE
Comment: NEGATIVE
Comment: NORMAL
Neisseria Gonorrhea: NEGATIVE
Trichomonas: NEGATIVE

## 2022-02-04 LAB — HEPATITIS B SURFACE ANTIGEN: Hepatitis B Surface Ag: NEGATIVE

## 2022-02-04 LAB — HEPATITIS C ANTIBODY: Hep C Virus Ab: NONREACTIVE

## 2022-02-04 LAB — HIV ANTIBODY (ROUTINE TESTING W REFLEX): HIV Screen 4th Generation wRfx: NONREACTIVE

## 2022-02-04 LAB — RPR: RPR Ser Ql: NONREACTIVE

## 2022-02-09 LAB — CYTOLOGY - PAP: Diagnosis: NEGATIVE

## 2022-04-24 ENCOUNTER — Ambulatory Visit (INDEPENDENT_AMBULATORY_CARE_PROVIDER_SITE_OTHER): Payer: Medicaid Other

## 2022-04-24 DIAGNOSIS — Z3042 Encounter for surveillance of injectable contraceptive: Secondary | ICD-10-CM

## 2022-04-24 MED ORDER — MEDROXYPROGESTERONE ACETATE 150 MG/ML IM SUSP
150.0000 mg | Freq: Once | INTRAMUSCULAR | Status: AC
  Administered 2022-04-24: 150 mg via INTRAMUSCULAR

## 2022-04-24 NOTE — Progress Notes (Signed)
Date last pap: 02/03/22. Last Depo-Provera: 02/03/22. Side Effects if any: N/A. Serum HCG indicated? N/A. Depo-Provera 150 mg IM given by: Donaciano Eva RN, Injection given in LD. Patient tolerated well. Next appointment due May 4- May 18.

## 2022-07-13 ENCOUNTER — Ambulatory Visit (INDEPENDENT_AMBULATORY_CARE_PROVIDER_SITE_OTHER): Payer: Medicaid Other

## 2022-07-13 DIAGNOSIS — Z3042 Encounter for surveillance of injectable contraceptive: Secondary | ICD-10-CM

## 2022-07-13 MED ORDER — MEDROXYPROGESTERONE ACETATE 150 MG/ML IM SUSY
150.0000 mg | PREFILLED_SYRINGE | Freq: Once | INTRAMUSCULAR | Status: AC
Start: 2022-07-13 — End: 2022-07-13
  Administered 2022-07-13: 150 mg via INTRAMUSCULAR

## 2022-07-13 NOTE — Progress Notes (Signed)
Date last pap: 02/03/22 Normal. Last Depo-Provera: 04/24/22. Side Effects if any: N/A. Serum HCG indicated? N/A. Depo-Provera 150 mg IM given by: Dreama Saa RN, Injection given in LD. Per patient request. Patient tolerated well. Next appointment due July 22-Aug 5.   Office supply provided. Patient states that pharmacy would not have rx ready until 5/15.

## 2022-08-10 ENCOUNTER — Ambulatory Visit
Admission: EM | Admit: 2022-08-10 | Discharge: 2022-08-10 | Disposition: A | Discharge status: Home / Self Care | Payer: Medicaid Other | Attending: Family Medicine | Admitting: Family Medicine

## 2022-08-10 DIAGNOSIS — H60392 Other infective otitis externa, left ear: Secondary | ICD-10-CM

## 2022-08-10 MED ORDER — CIPROFLOXACIN-DEXAMETHASONE 0.3-0.1 % OT SUSP: 4.0000 [drp] | Freq: Two times a day (BID) | OTIC | 0 refills | Status: AC

## 2022-08-10 NOTE — Discharge Instructions (Signed)
Return if symptoms worsen or do not improve after completion of 7-day treatment.

## 2022-08-10 NOTE — ED Provider Notes (Signed)
[facility]    CSN: [id number] Arrival date & time: 08/10/22  0910      History   Chief Complaint Chief Complaint  Patient presents with   Ear Fullness    HPI [name] is a 32 y.o. female.   HPI Patient here left ear fullness, popping and intermittent pain that is radiating into the back of her ear.  She reports symptoms started about a week ago.  She reports having a viral illness about 2 weeks ago and symptoms of the left ear started 1 week after illness. She reports her ear symptoms started with fullness and popping and over the last 2 days has progressed to pain in the left ear.  She denies any drainage.  She is not currently experiencing any URI symptoms. 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   Past Surgical History:  Procedure Laterality Date   WISDOM TOOTH EXTRACTION Bilateral 2010    OB History     Gravida  3   Para  3   Term  3   Preterm  0   AB  0   Living  3      SAB  0   IAB  0   Ectopic  0   Multiple  0   Live Births  3            Home Medications    Prior to Admission medications   Medication Sig Start Date End Date Taking? Authorizing Provider  ciprofloxacin-dexamethasone (CIPRODEX) OTIC suspension Place 4 drops into the left ear 2 (two) times daily for 7 days. 08/10/22 08/17/22 Yes Bing Neighbors, NP  acetaminophen (TYLENOL) 325 MG tablet Take 2 tablets (650 mg total) by mouth every 4 (four) hours as needed (for pain scale < 4). 11/19/20   [place]um[place], Hiram Comber, DO  ferrous sulfate 325 (65 FE) MG EC tablet Take 1 tablet (325 mg total) by mouth every other day. 11/19/20   [place], Hiram Comber, DO  medroxyPROGESTERone (DEPO-PROVERA) 150 MG/ML injection INJECT 1 ML  (150 MG TOTAL) INTO THE MUSCLE EVERY 3 (THREE) MONTHS 10/22/21   Warden Fillers, MD    Family History Family History  Problem Relation Age of Onset   COPD Mother    Hypertension Father    Seizures Brother    Diabetes Paternal Grandmother    Hypertension Paternal Grandmother    Diabetes Paternal Grandfather    Hypertension Paternal Grandfather    Diabetes Paternal Aunt    Diabetes Paternal Uncle    Cancer Neg Hx     Social History Social History   Tobacco Use   Smoking status: Never   Smokeless tobacco: Never  Vaping Use   Vaping Use: Never used  Substance Use Topics   Alcohol use: No   Drug use: No     Allergies   Banana and Orange fruit [citrus]   Review of Systems Review of Systems Pertinent negatives listed in HPI   Physical Exam Triage Vital Signs ED Triage Vitals  Enc Vitals Group     BP 08/10/22 0937 122/80     Pulse Rate 08/10/22 0937 80     Resp 08/10/22 0937 18  Temp 08/10/22 0937 98.4 F (36.9 C)     Temp Source 08/10/22 0937 Oral     SpO2 08/10/22 0937 98 %     Weight --      Height --      Head Circumference --      Peak Flow --      Pain Score 08/10/22 0936 7     Pain Loc --      Pain Edu? --      Excl. in GC? --    No data found.  Updated Vital Signs BP 122/80 (BP Location: Right Arm)   Pulse 80   Temp 98.4 F (36.9 C) (Oral)   Resp 18   SpO2 98%   Visual Acuity Right Eye Distance:   Left Eye Distance:   Bilateral Distance:    Right Eye Near:   Left Eye Near:    Bilateral Near:     Physical Exam Vitals reviewed.  Constitutional:      Appearance: Normal appearance.  HENT:     Left Ear: Tenderness present. There is mastoid tenderness. Tympanic membrane is erythematous.  Cardiovascular:     Rate and Rhythm: Normal rate and regular rhythm.  Pulmonary:     Effort: Pulmonary effort is normal.     Breath sounds: Normal breath sounds.  Musculoskeletal:     Cervical back: Normal range of motion.  Lymphadenopathy:      Cervical: No cervical adenopathy.  Neurological:     Mental Status: She is alert.      UC Treatments / Results  Labs (all labs ordered are listed, but only abnormal results are displayed) Labs Reviewed - No data to display  EKG   Radiology No results found.  Procedures Procedures (including critical care time)  Medications Ordered in UC Medications - No data to display  Initial Impression / Assessment and Plan / UC Course  I have reviewed the triage vital signs and the nursing notes.  Pertinent labs & imaging results that were available during my care of the patient were reviewed by me and considered in my medical decision making (see chart for details).    Infective otitis externa of left ear, treatment with Ciprodex twice daily x 7 days. Return precautions given if symptoms worsen or do not improve. Final Clinical Impressions(s) / UC Diagnoses   Final diagnoses:  Infective otitis externa of left ear     Discharge Instructions      Return if symptoms worsen or do not improve after completion of 7-day treatment.     ED Prescriptions     Medication Sig Dispense Auth. Provider   ciprofloxacin-dexamethasone (CIPRODEX) OTIC suspension Place 4 drops into the left ear 2 (two) times daily for 7 days. 7.5 mL Bing Neighbors, NP      PDMP not reviewed this encounter.   Bing Neighbors, NP 08/11/22 [phone]

## 2022-08-10 NOTE — ED Triage Notes (Signed)
Pt c/o last week left ear popping, feels full and with hearing loss.

## 2022-10-06 ENCOUNTER — Ambulatory Visit (INDEPENDENT_AMBULATORY_CARE_PROVIDER_SITE_OTHER): Payer: Medicaid Other | Admitting: Emergency Medicine

## 2022-10-06 VITALS — BP 129/88 | HR 97 | Ht 65.0 in | Wt 183.4 lb

## 2022-10-06 DIAGNOSIS — Z3042 Encounter for surveillance of injectable contraceptive: Secondary | ICD-10-CM

## 2022-10-06 MED ORDER — MEDROXYPROGESTERONE ACETATE 150 MG/ML IM SUSP
150.0000 mg | Freq: Once | INTRAMUSCULAR | Status: AC
  Administered 2022-10-06: 150 mg via INTRAMUSCULAR

## 2022-10-06 NOTE — Progress Notes (Signed)
Date last pap: 02/04/2023. Last Depo-Provera: 07/13/2022. Side Effects if any: NONE. Serum HCG indicated? NA. Depo-Provera 150 mg IM given by: Resa Miner, RN into LD. Tolerated well, no adverse reactions reported. Next appointment due Oct 15-29.

## 2022-11-17 ENCOUNTER — Other Ambulatory Visit: Payer: Self-pay | Admitting: Obstetrics and Gynecology

## 2022-11-17 DIAGNOSIS — Z3042 Encounter for surveillance of injectable contraceptive: Secondary | ICD-10-CM

## 2022-12-22 ENCOUNTER — Ambulatory Visit: Payer: Medicaid Other | Admitting: *Deleted

## 2022-12-22 DIAGNOSIS — Z3042 Encounter for surveillance of injectable contraceptive: Secondary | ICD-10-CM

## 2022-12-22 MED ORDER — MEDROXYPROGESTERONE ACETATE 150 MG/ML IM SUSP
150.0000 mg | Freq: Once | INTRAMUSCULAR | Status: AC
  Administered 2022-12-22: 150 mg via INTRAMUSCULAR

## 2022-12-22 NOTE — Progress Notes (Signed)
Date last pap: 02/03/22. Last Depo-Provera: 10/06/22. Side Effects if any: N/A. Serum HCG indicated? N/A. Depo-Provera 150 mg IM given by: S.Malen Gauze, CMA. Next appointment due 03/09/23-03/23/23.

## 2023-03-08 ENCOUNTER — Other Ambulatory Visit: Payer: Self-pay | Admitting: Obstetrics and Gynecology

## 2023-03-08 DIAGNOSIS — Z3042 Encounter for surveillance of injectable contraceptive: Secondary | ICD-10-CM

## 2023-03-09 ENCOUNTER — Ambulatory Visit: Payer: Medicaid Other

## 2023-03-09 VITALS — BP 131/83 | HR 80 | Wt 197.0 lb

## 2023-03-09 DIAGNOSIS — Z3042 Encounter for surveillance of injectable contraceptive: Secondary | ICD-10-CM | POA: Diagnosis not present

## 2023-03-09 MED ORDER — MEDROXYPROGESTERONE ACETATE 150 MG/ML IM SUSP
150.0000 mg | INTRAMUSCULAR | Status: AC
  Administered 2023-03-09 – 2024-02-09 (×3): 150 mg via INTRAMUSCULAR

## 2023-03-09 NOTE — Progress Notes (Signed)
 Pt is in the office for depo injection. Administered in L Del, per pt request, and pt tolerated well. Next due March 18- April 1.  .. Administrations This Visit     medroxyPROGESTERone  (DEPO-PROVERA ) injection 150 mg     Admin Date 03/09/2023 Action Given Dose 150 mg Route Intramuscular Documented By Doneta Laymon BIRCH, RN

## 2023-06-01 ENCOUNTER — Encounter: Payer: Self-pay | Admitting: Advanced Practice Midwife

## 2023-06-01 ENCOUNTER — Ambulatory Visit: Payer: Medicaid Other | Admitting: Advanced Practice Midwife

## 2023-06-01 ENCOUNTER — Other Ambulatory Visit (HOSPITAL_COMMUNITY)
Admission: RE | Admit: 2023-06-01 | Discharge: 2023-06-01 | Disposition: A | Discharge status: Home / Self Care | Source: Ambulatory Visit | Attending: Advanced Practice Midwife | Admitting: Advanced Practice Midwife

## 2023-06-01 ENCOUNTER — Ambulatory Visit: Payer: Medicaid Other

## 2023-06-01 VITALS — BP 131/89 | HR 85 | Ht 65.0 in | Wt 195.0 lb

## 2023-06-01 DIAGNOSIS — Z113 Encounter for screening for infections with a predominantly sexual mode of transmission: Secondary | ICD-10-CM

## 2023-06-01 DIAGNOSIS — Z3042 Encounter for surveillance of injectable contraceptive: Secondary | ICD-10-CM

## 2023-06-01 DIAGNOSIS — Z01419 Encounter for gynecological examination (general) (routine) without abnormal findings: Secondary | ICD-10-CM | POA: Diagnosis not present

## 2023-06-01 DIAGNOSIS — Z3009 Encounter for other general counseling and advice on contraception: Secondary | ICD-10-CM | POA: Diagnosis not present

## 2023-06-01 LAB — CERVICOVAGINAL ANCILLARY ONLY
Chlamydia: NEGATIVE
Comment: NEGATIVE
Comment: NEGATIVE
Comment: NORMAL
Neisseria Gonorrhea: NEGATIVE
Trichomonas: NEGATIVE

## 2023-06-01 MED ORDER — MEDROXYPROGESTERONE ACETATE 150 MG/ML IM SUSP
150.0000 mg | Freq: Once | INTRAMUSCULAR | Status: AC
Start: 2023-06-01 — End: 2023-06-01
  Administered 2023-06-01: 150 mg via INTRAMUSCULAR

## 2023-06-01 MED ORDER — MEDROXYPROGESTERONE ACETATE 150 MG/ML IM SUSP
150.0000 mg | INTRAMUSCULAR | 3 refills | Status: AC
Start: 2023-06-01 — End: ?

## 2023-06-01 NOTE — Progress Notes (Signed)
 Pt presents for AEX Last PAP 01/2022 Requesting STD testing  Due for Depo today, requesting [facility] consult but wants Depo today  Last Depo-Provera: 03-08-24. Side Effects if any: N/A pt tolerated well. Serum HCG indicated? N/A Depo given on schedule. Depo-Provera 150 mg IM given by: Hope Pigeon, CMA in the RD per pt request. Next appointment due 6/10-6/24.

## 2023-06-01 NOTE — Progress Notes (Signed)
   Subjective:     [name] is a 33 y.o. female here at [facility] for a routine exam.  Current complaints: weight gain on Depo, open to discussing other options, but likes Depo overall.  Personal and family health history reviewed: yes.  Do you have a primary care provider? yes Do you feel safe at home? yes  Flowsheet Row Office Visit from [facility]/03/2022 in [facility] Total Score 0       Health Maintenance Due  Topic Date Due   INFLUENZA VACCINE  Never done   COVID-19 Vaccine (3 - 2024-25 season) 11/08/2022     Risk factors for chronic health problems: Smoking: Alchohol/how much: Pt BMI: Body mass index is 32.45 kg/m.   Gynecologic History No LMP recorded. Patient has had an injection. Contraception: Depo-Provera injections Last Pap: 2023. Results were: normal Last mammogram: n/a.   Obstetric History OB History  Gravida Para Term Preterm AB Living  3 3 3  0 0 3  SAB IAB Ectopic Multiple Live Births  0 0 0 0 3    # Outcome Date GA Lbr Len/2nd Weight Sex Type Anes PTL Lv  3 Term 11/17/20 [redacted]w[redacted]d 07:10 / 00:22 7 lb 10.9 oz (3.484 kg) M Vag-Spont EPI  LIV  2 Term 11/08/18 [redacted]w[redacted]d 29:40 7 lb 4.9 oz (3.314 kg) F Vag-Spont EPI  LIV     Birth Comments: wnl  1 Term 05/29/15 [redacted]w[redacted]d 28:08 / 02:56 7 lb 0.7 oz (3.195 kg) M Vag-Spont EPI  LIV     The following portions of the patient's history were reviewed and updated as appropriate: allergies, current medications, past family history, past medical history, past social history, past surgical history, and problem list.  Review of Systems Pertinent items noted in HPI and remainder of comprehensive ROS otherwise negative.    Objective:  BP 131/89   Pulse 85   Ht 5\' 5"  (1.651 m)   Wt 195 lb (88.5 kg)   BMI 32.45 kg/m   VS reviewed, nursing note reviewed,  Constitutional: well developed, well nourished, no distress HEENT: normocephalic, thyroid without enlargement or  mass HEART: RRR, no murmurs rubs/gallops RESP: clear and equal to auscultation bilaterally in all lobes  Breast Exam:  exam performed: right breast normal without mass, skin or nipple changes or axillary nodes, left breast normal without mass, skin or nipple changes or axillary nodes Abdomen: soft Neuro: alert and oriented x 3 Skin: warm, dry Psych: affect normal Pelvic exam: Deferred     Assessment/Plan:   1. Encounter for annual routine gynecological examination (Primary) --No gyn concerns or problems.  - medroxyPROGESTERone (DEPO-PROVERA) injection 150 mg - medroxyPROGESTERone (DEPO-PROVERA) 150 MG/ML injection; Inject 1 mL (150 mg total) into the muscle every 3 (three) months.  Dispense: 1 mL; Refill: 3  2. Routine screening for STI (sexually transmitted infection)  - Cervicovaginal ancillary only( [place]) - HIV antibody (with reflex) - RPR - Hepatitis C Antibody - Hepatitis B Surface AntiGEN  3. Encounter for counseling regarding contraception --Discussed pt contraceptive plans and reviewed contraceptive methods based on pt preferences and effectiveness.  Pt prefers to have Depo injection today but is considering Mirena at annual exam next year.   --Information sent via MyChart for pt to learn more about contraceptive choices.      Return in about 1 year (around 05/31/2024) for annual exam.   Sharen Counter, CNM 1:40 PM

## 2023-06-02 LAB — HEPATITIS C ANTIBODY: Hep C Virus Ab: NONREACTIVE

## 2023-06-02 LAB — HEPATITIS B SURFACE ANTIGEN: Hepatitis B Surface Ag: NEGATIVE

## 2023-06-02 LAB — HIV ANTIBODY (ROUTINE TESTING W REFLEX): HIV Screen 4th Generation wRfx: NONREACTIVE

## 2023-06-02 LAB — RPR: RPR Ser Ql: NONREACTIVE

## 2023-09-06 ENCOUNTER — Ambulatory Visit

## 2023-09-06 DIAGNOSIS — Z3042 Encounter for surveillance of injectable contraceptive: Secondary | ICD-10-CM

## 2023-09-06 MED ORDER — MEDROXYPROGESTERONE ACETATE 150 MG/ML IM SUSP
150.0000 mg | Freq: Once | INTRAMUSCULAR | Status: AC
  Administered 2023-09-06: 150 mg via INTRAMUSCULAR

## 2023-09-06 NOTE — Progress Notes (Signed)
 Date last pap: 02/03/22. Last Depo-Provera : 06/01/23. Side Effects if any: Na. Serum HCG indicated? Na. Depo-Provera  150 mg IM given by: Kale Dols,cMA. Next appointment due 11/22/23-12/06/23 .

## 2023-11-22 ENCOUNTER — Ambulatory Visit

## 2023-11-22 VITALS — BP 133/76 | HR 88

## 2023-11-22 DIAGNOSIS — Z3042 Encounter for surveillance of injectable contraceptive: Secondary | ICD-10-CM

## 2023-11-22 DIAGNOSIS — Z3202 Encounter for pregnancy test, result negative: Secondary | ICD-10-CM | POA: Diagnosis not present

## 2023-11-22 NOTE — Progress Notes (Signed)
 Pt is in the office for depo injection. Administered in L Del and pt tolerated well Next due Dec 1-15 .SABRA Administrations This Visit     medroxyPROGESTERone  (DEPO-PROVERA ) injection 150 mg     Admin Date 11/22/2023 Action Given Dose 150 mg Route Intramuscular Documented By Doneta Laymon BIRCH, RN

## 2024-02-09 ENCOUNTER — Ambulatory Visit

## 2024-02-09 VITALS — BP 129/90 | HR 89 | Wt 196.0 lb

## 2024-02-09 DIAGNOSIS — Z3042 Encounter for surveillance of injectable contraceptive: Secondary | ICD-10-CM

## 2024-02-09 NOTE — Progress Notes (Signed)
 Date last pap: 02/03/2022. Last Depo-Provera : 11/22/23. Side Effects if any: N/a. Serum HCG indicated? N/a. Depo-Provera  150 mg IM in L Del, per pt request Next appointment due Feb 18- March 4. .. Administrations This Visit     medroxyPROGESTERone  (DEPO-PROVERA ) injection 150 mg     Admin Date 02/09/2024 Action Given Dose 150 mg Route Intramuscular Documented By Doneta Laymon BIRCH, RN

## 2024-04-26 ENCOUNTER — Ambulatory Visit

## 2024-06-01 ENCOUNTER — Ambulatory Visit: Admitting: Obstetrics and Gynecology
